# Patient Record
Sex: Male | Born: 1995 | Race: White | Hispanic: No | Marital: Single | Smoking: Current every day smoker
Health system: Southern US, Community
[De-identification: ages and names within clinical notes are randomized; demographics above are authoritative.]

## PROBLEM LIST (undated history)

## (undated) DIAGNOSIS — J45909 Unspecified asthma, uncomplicated: Secondary | ICD-10-CM

## (undated) HISTORY — DX: Unspecified asthma, uncomplicated: J45.909

## (undated) HISTORY — PX: BACK SURGERY: SHX140

---
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
## 2017-01-18 NOTE — Progress Notes (Signed)
[facility]      Assessment and Plan:  21 year old male with persistent cough, recurrent episodes of bronchitis, consistent with the diagnosis of asthma.  Severe persistent asthma, with exacerbation, and acute bronchitis, GERD symptoms, chronic rhinitis with persistent coughing. --Discussed continued use of his current medications.  Given that he has  finally turned the corner with his asthma control, I want to keep him on his current dose of Symbicort for the time being.  If at the next visit he continues to do well we can cut back on his Symbicort to 1 puff once or twice daily. -We will also prescribe a rescue inhaler to be used as needed. -Will refer to allergist for allergy testing, given peripheral eosinophilia seen on CBC  Meds ordered this encounter  Medications  . albuterol (PROVENTIL HFA;VENTOLIN HFA) 108 (90 Base) MCG/ACT inhaler    Sig: Inhale 2 puffs every 6 (six) hours as needed into the lungs.    Dispense:  1 Inhaler    Refill:  3     Return in about 3 months (around 04/22/2017).  Date: 01/18/2017  MRN# [id number] Arnette SchaumannHunter Mayers 07/02/1995   Arnette SchaumannHunter Blaisdell is a 21 y.o. old male seen in consultation for chief complaint of:    Chief Complaint  Patient presents with  . Asthma    Pt here for f/u up and reports cough come and goes. He did have bloodwork and cxr.  . Shortness of Breath    while running.    HPI:  Patient was seen recently with recurrent episodes of bronchitis thought to be consistent with a diagnosis of asthma.  He was started on Symbicort, prednisone taper, omeprazole for symptomatic GERD, Allegra for allergies, and Flonase for allergic rhinitis.  He had smoked up until about 4 months ago. Lab work showed significant peripheral eosinophilia consistent with asthma.  He has been using symbicort 2 puffs twice daily. He is taking prilosec, flonase and allegra once daily.  He feels that symptoms are well controlled and he has  occasional cough.  He is doing much better than he was before.  He is hungry much of the time however he has gained approximately 20 pounds.  **Images personally reviewed, chest x-ray 12/23/16; normal lungs. **CBC 12/23/16, absolute eosinophils equals 600   PMHX:   No past medical history on file. Surgical Hx:  No past surgical history on file. Family Hx:  Family History  Problem Relation Age of Onset  . Pneumonia Paternal Grandmother   . Asthma Paternal Grandmother    Social Hx:   Social History   Tobacco Use  . Smoking status: Former Games developermoker  . Smokeless tobacco: Never Used  Substance Use Topics  . Alcohol use: No  . Drug use: No   Medication:    Current Outpatient Medications:  .  albuterol (PROVENTIL HFA;VENTOLIN HFA) 108 (90 Base) MCG/ACT inhaler, Inhale 2 puffs into the lungs every 6 (six) hours as needed., Disp: , Rfl:  .  budesonide-formoterol (SYMBICORT) 160-4.5 MCG/ACT inhaler, Inhale 2 puffs into the lungs 2 (two) times daily. Rinse mouth after each use, Disp: 1 Inhaler, Rfl: 12 .  predniSONE (STERAPRED UNI-PAK 21 TAB) 10 MG (21) TBPK tablet, Take as directed for 6 days, Disp: 21 tablet, Rfl: 0   Allergies:  Patient has no known allergies.  Review of Systems: Gen:  Denies  fever, sweats, chills HEENT: Denies blurred vision, double vision. bleeds, sore throat Cvc:  No dizziness, chest pain. Resp:   Denies cough  or sputum production, shortness of breath Gi: Denies swallowing difficulty, stomach pain. Gu:  Denies bladder incontinence, burning urine Ext:   No Joint pain, stiffness. Skin: No skin rash,  hives  Endoc:  No polyuria, polydipsia. Psych: No depression, insomnia. Other:  All other systems were reviewed with the patient and were negative other that what is mentioned in the HPI.   Physical Examination:   VS: BP 126/80 (BP Location: Left Arm, Cuff Size: Large)   Pulse 79   Resp 16   Ht 6\' 3"  (1.905 m)   Wt 221 lb (100.2 kg)   SpO2 97%   BMI 27.62  kg/m   General Appearance: coughing profusely.  Neuro:without focal findings,  speech normal,  HEENT: PERRLA, EOM intact.   Pulmonary: normal breath sounds, scattered diffuse bilateral wheezing.  CardiovascularNormal S1,S2.  No m/r/g.   Abdomen: Benign, Soft, non-tender. Renal:  No costovertebral tenderness  GU:  No performed at this time. Endoc: No evident thyromegaly, no signs of acromegaly. Skin:   warm, no rashes, no ecchymosis  Extremities: normal, no cyanosis, clubbing.  Other findings:    LABORATORY PANEL:   CBC No results for input(s): WBC, HGB, HCT, PLT in the last 168 hours. ------------------------------------------------------------------------------------------------------------------  Chemistries  No results for input(s): NA, K, CL, CO2, GLUCOSE, BUN, CREATININE, CALCIUM, MG, AST, ALT, ALKPHOS, BILITOT in the last 168 hours.  Invalid input(s): GFRCGP ------------------------------------------------------------------------------------------------------------------  Cardiac Enzymes No results for input(s): TROPONINI in the last 168 hours. ------------------------------------------------------------  RADIOLOGY:  No results found.     Thank  you for the consultation and for allowing [facility] to assist in the care of your patient. Our recommendations are noted above.  Please contact us if we can be of further service.   Wells Guileseep Joshua Soulier, MD.  Board Certified in Internal Medicine, Pulmonary Medicine, Critical Care Medicine, and Sleep Medicine.  [place] [facility] Office Number: [phone]  Santiago Gladavid Kasa, M.D.  Billy Fischeravid Simonds, M.D  01/18/2017

## 2017-01-20 ENCOUNTER — Ambulatory Visit (INDEPENDENT_AMBULATORY_CARE_PROVIDER_SITE_OTHER): Payer: BLUE CROSS/BLUE SHIELD | Admitting: Internal Medicine

## 2017-01-20 ENCOUNTER — Encounter: Payer: Self-pay | Admitting: Internal Medicine

## 2017-01-20 DIAGNOSIS — J4541 Moderate persistent asthma with (acute) exacerbation: Secondary | ICD-10-CM

## 2017-01-20 MED ORDER — ALBUTEROL SULFATE HFA 108 (90 BASE) MCG/ACT IN AERS: 2.0000 | INHALATION_SPRAY | Freq: Four times a day (QID) | RESPIRATORY_TRACT | 3 refills | Status: DC | PRN

## 2017-01-20 MED ORDER — ALBUTEROL SULFATE 108 (90 BASE) MCG/ACT IN AEPB: 2.0000 | INHALATION_SPRAY | RESPIRATORY_TRACT | 5 refills | Status: DC | PRN

## 2017-01-20 NOTE — Addendum Note (Signed)
Addended by: Shane CrutchAMACHANDRAN, Kristel Durkee on: 01/20/2017 05:24 PM   Modules accepted: Orders

## 2017-01-20 NOTE — Patient Instructions (Addendum)
Will refer to allergist for allergy testing.   Will give presciption for a proventil "rescue" inhaler You can use this when your breathing is worse, take 2 puffs as needed as often as every 4 hours.

## 2017-02-05 ENCOUNTER — Other Ambulatory Visit: Payer: Self-pay | Admitting: Internal Medicine

## 2018-03-10 ENCOUNTER — Telehealth: Payer: Self-pay

## 2018-03-10 NOTE — Telephone Encounter (Signed)
LM for patient to call to make apt, received email from Dr. Belia HemanKasa that patient needs to be seen for recurrent pneumonias.

## 2018-03-16 ENCOUNTER — Encounter: Payer: Self-pay | Admitting: Pulmonary Disease

## 2018-03-16 ENCOUNTER — Ambulatory Visit (INDEPENDENT_AMBULATORY_CARE_PROVIDER_SITE_OTHER): Payer: Commercial Managed Care - PPO | Admitting: Pulmonary Disease

## 2018-03-16 VITALS — BP 104/74 | HR 90 | Resp 16 | Ht 75.0 in | Wt 203.0 lb

## 2018-03-16 DIAGNOSIS — J329 Chronic sinusitis, unspecified: Secondary | ICD-10-CM | POA: Diagnosis not present

## 2018-03-16 DIAGNOSIS — R05 Cough: Secondary | ICD-10-CM | POA: Diagnosis not present

## 2018-03-16 DIAGNOSIS — F17200 Nicotine dependence, unspecified, uncomplicated: Secondary | ICD-10-CM | POA: Diagnosis not present

## 2018-03-16 DIAGNOSIS — J4540 Moderate persistent asthma, uncomplicated: Secondary | ICD-10-CM

## 2018-03-16 DIAGNOSIS — R053 Chronic cough: Secondary | ICD-10-CM

## 2018-03-16 MED ORDER — FLUTICASONE PROPIONATE 50 MCG/ACT NA SUSP: 2.0000 | Freq: Every day | NASAL | 10 refills | Status: DC

## 2018-03-16 MED ORDER — BUDESONIDE-FORMOTEROL FUMARATE 160-4.5 MCG/ACT IN AERO: 2.0000 | INHALATION_SPRAY | Freq: Two times a day (BID) | RESPIRATORY_TRACT | 10 refills | Status: DC

## 2018-03-16 MED ORDER — ALBUTEROL SULFATE 108 (90 BASE) MCG/ACT IN AEPB: 2.0000 | INHALATION_SPRAY | RESPIRATORY_TRACT | 10 refills | Status: DC | PRN

## 2018-03-16 NOTE — Progress Notes (Signed)
ACUTE PULMONARY OFFICE VISIT  CHRONIC PULMONARY PROBLEMS: Smoker Chronic cough Severe persistent asthma Chronic rhinitis  ACUTE PROBLEM: "Recurrent pneumonia"   SUBJ: Formerly seen by Dr Nicholos Johns with documented history of severe persistent asthma.  I do not see any PFTs in our records.  He is added on today and attends this encounter with his father with complaint of "recurrent pneumonia".  His father reports that he has had "6-7 pneumonias since 2017".  None of these episodes resulted in hospitalization.  He does not believe that he got chest x-rays for each of these episodes.  He has been variably treated with antibiotics and/or prednisone.  Last episode was a couple of months ago.  He does not recall how he was treated at that time.  He is unable to say whether these medications made a difference.  His chief complaint is chronic cough productive of white to discolored mucus.  This happens on a nearly daily basis.  He denies significant exertional dyspnea.  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  During boot camp training, he did not feel limited by exertional dyspnea.  He has a prior diagnosis of asthma but this was never proven with PFTs.  He was previously prescribed Symbicort inhaler but he does not use this on a regular basis.  He does not believe that it helps significantly when he does use it "as needed".  He has previously been prescribed an albuterol inhaler but he does not have this medication.  He smokes 1/2 pack cigarettes per day.  He has made no concerted effort to quit smoking.  However, he is willing to work on smoking cessation now.  OBJ: Vitals:   03/16/18 0918  BP: 104/74  Pulse: 90  Resp: 16  SpO2: 97%  Weight: 203 lb (92.1 kg)  Height: 6\' 3"  (1.905 m)   Gen: NAD HEENT: NCAT, sclera white, TMs retracted bilaterally, moderate rhinitis Neck: No JVD Lungs: breath sounds full and the course without wheezes or other  adventitious sounds Cardiovascular: RRR, no murmurs Abdomen: Soft, nontender, normal BS Ext: without clubbing, cyanosis, edema Neuro: grossly intact Skin: Limited exam, no lesions noted  No flowsheet data found.  CBC Latest Ref Rng & Units 12/23/2016  WBC 3.8 - 10.6 K/uL 9.7  Hemoglobin 13.0 - 18.0 g/dL 38.4  Hematocrit 66.5 - 52.0 % 49.2  Platelets 150 - 440 K/uL 247   CXR: No recent film available   I have personally reviewed all chest radiographs reported above including CXRs and CT chest unless otherwise indicated  IMPRESSION: Smoker - Plan: Pulmonary Function Test [facility] Only  Chronic cough - Plan: Pulmonary Function Test [facility] Only  Chronic sinusitis, unspecified location  Probable moderate persistent asthma  - Plan: Pulmonary Function Test [facility] Only  During this encounter, we spent most of the time discussing the imperative of smoking cessation.  For now, asthma is a provisional diagnosis until proven with PFTs and/or response to asthma therapy  PLAN:   NO SMOKING!!! Resume Symbicort 160-4.5, 2 actuations twice a day EVERY DAY.  Rinse mouth after use Resume albuterol inhaler, 2 actuations as needed for increased cough, wheezing, chest tightness, shortness of breath New prescription: Flonase, 2 sprays per nostril daily  Follow-up in 6-8 weeks with PFTs (lung function test) prior to that visit   I have reviewed this patient's medical problems, current medications and therapies and prior pulmonary office notes in evaluation and formulation of the above assessment and plan  Arthur Fischer, MD PCCM service Mobile [phone] Pager [phone] 03/16/2018 10:04 AM

## 2018-03-16 NOTE — Patient Instructions (Signed)
NO SMOKING!!! Resume Symbicort 160-4.5, 2 actuations twice a day EVERY DAY.  Rinse mouth after use Resume albuterol inhaler, 2 actuations as needed for increased cough, wheezing, chest tightness, shortness of breath New prescription: Flonase, 2 sprays per nostril daily  Follow-up in 6-8 weeks with PFTs (lung function test) prior to that visit

## 2018-04-11 ENCOUNTER — Ambulatory Visit: Payer: Commercial Managed Care - PPO | Attending: Pulmonary Disease

## 2018-04-11 DIAGNOSIS — R053 Chronic cough: Secondary | ICD-10-CM

## 2018-04-11 DIAGNOSIS — R05 Cough: Secondary | ICD-10-CM | POA: Diagnosis not present

## 2018-04-11 DIAGNOSIS — F17200 Nicotine dependence, unspecified, uncomplicated: Secondary | ICD-10-CM

## 2018-04-11 DIAGNOSIS — J4540 Moderate persistent asthma, uncomplicated: Secondary | ICD-10-CM | POA: Insufficient documentation

## 2018-04-17 ENCOUNTER — Encounter: Payer: Self-pay | Admitting: Pulmonary Disease

## 2018-04-17 ENCOUNTER — Ambulatory Visit (INDEPENDENT_AMBULATORY_CARE_PROVIDER_SITE_OTHER): Payer: Commercial Managed Care - PPO | Admitting: Pulmonary Disease

## 2018-04-17 VITALS — BP 124/80 | HR 102 | Resp 16 | Ht 75.0 in | Wt 195.0 lb

## 2018-04-17 DIAGNOSIS — J4540 Moderate persistent asthma, uncomplicated: Secondary | ICD-10-CM

## 2018-04-17 NOTE — Patient Instructions (Signed)
Continue Symbicort inhaler, 2 actuations twice a day.  Rinse mouth after use  Continue albuterol inhaler as needed for increased shortness of breath, wheezing, chest tightness, cough  Congratulations on smoking cessation. Do not restart!!!  Follow-up in 6 months.  Call sooner if needed

## 2018-04-20 NOTE — Progress Notes (Signed)
PULMONARY OFFICE FOLLOW-UP VISIT  PT PROFILE: 23 y.o. initially evaluated by Dr. Nicholos Johns with history of persistent asthma  DATA: 04/11/18: FVC: 6.27 > 6.50 L (96 > 100 %pred), FEV1: 4.15 > 5.04 L (76 > 92 %pred), FEV1/FVC: 66%, TLC: 7.53 L (95 %pred), DLCO 114 %pred  INTERVAL: Last seen 03/16/2018.  At that time, Symbicort was resumed and it was emphasized that he use it every day, twice a day.  Flonase was prescribed for symptoms of chronic rhinitis.  He was strongly encouraged to quit smoking.    SUBJ: This is a scheduled follow-up.  There have been no major events since last visit.  He has no new complaints.  His breathing and respiratory status are much improved.  He is using Symbicort compliantly.  He rarely requires albuterol.  He has quit smoking.  He denies CP, fever, purulent sputum, hemoptysis, LE edema and calf tenderness.  OBJ: Vitals:   04/17/18 1336 04/17/18 1337  BP:  124/80  Pulse:  (!) 102  Resp: 16   SpO2:  97%  Weight: 195 lb (88.5 kg)   Height: 6\' 3"  (1.905 m)   RA  Gen: NAD HEENT: NCAT, sclera white Neck: No JVD Lungs: breath sounds full, no wheezes or other adventitious sounds Cardiovascular: RRR, no murmurs Abdomen: Soft, nontender, normal BS Ext: without clubbing, cyanosis, edema Neuro: grossly intact Skin: Limited exam, no lesions noted   No flowsheet data found.  CBC Latest Ref Rng & Units 12/23/2016  WBC 3.8 - 10.6 K/uL 9.7  Hemoglobin 13.0 - 18.0 g/dL 79.7  Hematocrit 28.2 - 52.0 % 49.2  Platelets 150 - 440 K/uL 247   CXR: No new film   I have personally reviewed all chest radiographs reported above including CXRs and CT chest unless otherwise indicated  IMPRESSION: Moderate persistent asthma without complication  Asthma is well controlled now that he has quit smoking and is using his ICS/LABA controller inhaler regularly.  PLAN:   Continue Symbicort inhaler, 2 actuations twice a day.  Rinse mouth after use  Continue albuterol  inhaler as needed for increased shortness of breath, wheezing, chest tightness, cough  I congratulated him on on smoking cessation and emphasized the importance of avoiding relapse.  We discussed risk factors for relapse  Follow-up in 6 months.  Call sooner if needed   Billy Fischer, MD PCCM service Mobile [phone] Pager [phone] 04/20/2018 12:02 PM

## 2018-09-11 ENCOUNTER — Other Ambulatory Visit: Payer: Self-pay

## 2018-09-11 ENCOUNTER — Emergency Department
Admission: EM | Admit: 2018-09-11 | Discharge: 2018-09-11 | Disposition: A | Discharge status: Home / Self Care | Payer: Commercial Managed Care - PPO | Attending: Student in an Organized Health Care Education/Training Program | Admitting: Student in an Organized Health Care Education/Training Program

## 2018-09-11 ENCOUNTER — Encounter: Payer: Self-pay | Admitting: Emergency Medicine

## 2018-09-11 ENCOUNTER — Emergency Department: Payer: Commercial Managed Care - PPO

## 2018-09-11 DIAGNOSIS — Y999 Unspecified external cause status: Secondary | ICD-10-CM | POA: Insufficient documentation

## 2018-09-11 DIAGNOSIS — Z79899 Other long term (current) drug therapy: Secondary | ICD-10-CM | POA: Insufficient documentation

## 2018-09-11 DIAGNOSIS — Y9389 Activity, other specified: Secondary | ICD-10-CM | POA: Diagnosis not present

## 2018-09-11 DIAGNOSIS — W312XXA Contact with powered woodworking and forming machines, initial encounter: Secondary | ICD-10-CM | POA: Insufficient documentation

## 2018-09-11 DIAGNOSIS — S61313A Laceration without foreign body of left middle finger with damage to nail, initial encounter: Secondary | ICD-10-CM | POA: Diagnosis not present

## 2018-09-11 DIAGNOSIS — Y929 Unspecified place or not applicable: Secondary | ICD-10-CM | POA: Diagnosis not present

## 2018-09-11 DIAGNOSIS — S6992XA Unspecified injury of left wrist, hand and finger(s), initial encounter: Secondary | ICD-10-CM | POA: Diagnosis present

## 2018-09-11 DIAGNOSIS — Z87891 Personal history of nicotine dependence: Secondary | ICD-10-CM | POA: Diagnosis not present

## 2018-09-11 MED ORDER — OXYCODONE-ACETAMINOPHEN 5-325 MG PO TABS: 1.0000 | ORAL_TABLET | Freq: Four times a day (QID) | ORAL | 0 refills | Status: DC | PRN

## 2018-09-11 MED ORDER — OXYCODONE-ACETAMINOPHEN 5-325 MG PO TABS
1.0000 | ORAL_TABLET | Freq: Once | ORAL | Status: AC
  Administered 2018-09-11: 1 via ORAL
  Filled 2018-09-11: qty 1

## 2018-09-11 MED ORDER — LIDOCAINE HCL (PF) 1 % IJ SOLN
10.0000 mL | Freq: Once | INTRAMUSCULAR | Status: AC
  Administered 2018-09-11: 10 mL
  Filled 2018-09-11: qty 10

## 2018-09-11 MED ORDER — CEPHALEXIN 500 MG PO CAPS: 1000.0000 mg | ORAL_CAPSULE | Freq: Two times a day (BID) | ORAL | 0 refills | Status: DC

## 2018-09-11 NOTE — ED Provider Notes (Signed)
[facility] Emergency Department Provider Note  ____________________________________________  Time seen: Approximately 8:13 PM  I have reviewed the triage vital signs and the nursing notes.   HISTORY  Chief Complaint Finger Injury    HPI Arthur Guerra is a 23 y.o. male who presents the emergency department complaining of an injury to the middle finger of the left hand.  Patient was using a table saw when his finger accidentally made contact of the blade.  Patient reports injury to the dorsal aspect of the nail.  Bleeding is controlled with direct pressure and bandaging, immediately returns with opening the the bandage.  Full range of motion to the digit.  No other injury or complaint.  Up-to-date on tetanus immunization.         History reviewed. No pertinent past medical history.  There are no active problems to display for this patient.   History reviewed. No pertinent surgical history.  Prior to Admission medications   Medication Sig Start Date End Date Taking? Authorizing Provider  Albuterol Sulfate (PROAIR RESPICLICK) 536 (90 Base) MCG/ACT AEPB Inhale 2 puffs into the lungs every 4 (four) hours as needed. 03/16/18   Wilhelmina Mcardle, MD  budesonide-formoterol (SYMBICORT) 160-4.5 MCG/ACT inhaler Inhale 2 puffs into the lungs 2 (two) times daily. 03/16/18   Wilhelmina Mcardle, MD  cephALEXin (KEFLEX) 500 MG capsule Take 2 capsules (1,000 mg total) by mouth 2 (two) times daily. 09/11/18   [place] Reader, Charline Bills, PA-C  fluticasone (FLONASE) 50 MCG/ACT nasal spray Place 2 sprays into both nostrils daily. 03/16/18 03/16/19  Wilhelmina Mcardle, MD  oxyCODONE-acetaminophen (PERCOCET/ROXICET) 5-325 MG tablet Take 1 tablet by mouth every 6 (six) hours as needed for severe pain. 09/11/18   Flavio Lindroth, Charline Bills, PA-C    Allergies Patient has no known allergies.  Family History  Problem Relation Age of Onset  . Pneumonia Paternal Grandmother   . Asthma Paternal Grandmother      Social History Social History   Tobacco Use  . Smoking status: Former Research scientist (life sciences)  . Smokeless tobacco: Never Used  Substance Use Topics  . Alcohol use: No  . Drug use: No     Review of Systems  Constitutional: No fever/chills Eyes: No visual changes. No discharge ENT: No upper respiratory complaints. Cardiovascular: no chest pain. Respiratory: no cough. No SOB. Gastrointestinal: No abdominal pain.  No nausea, no vomiting.  No diarrhea.  No constipation. Musculoskeletal: Injury to the middle finger of the left hand Skin: Negative for rash, abrasions, lacerations, ecchymosis. Neurological: Negative for headaches, focal weakness or numbness. 10-point ROS otherwise negative.  ____________________________________________   PHYSICAL EXAM:  VITAL SIGNS: ED Triage Vitals  Enc Vitals Group     BP 09/11/18 1817 (!) 137/99     Pulse Rate 09/11/18 1817 91     Resp 09/11/18 1817 16     Temp 09/11/18 1817 98.1 F (36.7 C)     Temp Source 09/11/18 1817 Oral     SpO2 09/11/18 1817 96 %     Weight 09/11/18 1818 216 lb (98 kg)     Height 09/11/18 1818 6\' 3"  (1.905 m)     Head Circumference --      Peak Flow --      Pain Score 09/11/18 1818 8     Pain Loc --      Pain Edu? --      Excl. in [place]? --      Constitutional: Alert and oriented. Well appearing and in no  acute distress. Eyes: Conjunctivae are normal. PERRL. EOMI. Head: Atraumatic. Neck: No stridor.    Cardiovascular: Normal rate, regular rhythm. Normal S1 and S2.  Good peripheral circulation. Respiratory: Normal respiratory effort without tachypnea or retractions. Lungs CTAB. Good air entry to the bases with no decreased or absent breath sounds. Musculoskeletal: Full range of motion to all extremities. No gross deformities appreciated.  Visualization of the third digit left hand reveals deep avulsion type injury involving the nailbed.  Active bleeding.  No visible foreign body.  Full extension and flexion of the digit.   Sensation intact.  Good blood flow identified.  Avulsion type laceration measures approximately 1.5 cm in length.  This involves the lateral aspect of the nailbed. Neurologic:  Normal speech and language. No gross focal neurologic deficits are appreciated.  Skin:  Skin is warm, dry and intact. No rash noted. Psychiatric: Mood and affect are normal. Speech and behavior are normal. Patient exhibits appropriate insight and judgement.   ____________________________________________   LABS (all labs ordered are listed, but only abnormal results are displayed)  Labs Reviewed - No data to display ____________________________________________  EKG   ____________________________________________  RADIOLOGY I personally viewed and evaluated these images as part of my medical decision making, as well as reviewing the written report by the radiologist.  Dg Hand Complete Left  Result Date: 09/11/2018 CLINICAL DATA:  Table saw injury to third digit LEFT hand EXAM: LEFT HAND - COMPLETE 3+ VIEW COMPARISON:  None FINDINGS: Soft tissue swelling and irregularity at distal phalanx middle finger. Osseous mineralization normal. Joint spaces preserved. No acute fracture, dislocation, or bone destruction. Minimal soft tissue gas at distal phalanx middle finger but no radiopaque foreign body seen. IMPRESSION: No acute osseous abnormalities. Electronically Signed   By: Ulyses SouthwardMark  Boles M.D.   On: 09/11/2018 19:37    ____________________________________________    PROCEDURES  Procedure(s) performed:    Procedures    Medications  lidocaine (PF) (XYLOCAINE) 1 % injection 10 mL (has no administration in time range)  oxyCODONE-acetaminophen (PERCOCET/ROXICET) 5-325 MG per tablet 1 tablet (1 tablet Oral Given 09/11/18 1920)     ____________________________________________   INITIAL IMPRESSION / ASSESSMENT AND PLAN / ED COURSE  Pertinent labs & imaging results that were available during my care of the  patient were reviewed by me and considered in my medical decision making (see chart for details).  Review of the [place] CSRS was performed in accordance of the [facility] prior to dispensing any controlled drugs.           Patient's diagnosis is consistent with finger laceration.  Patient presented to the emergency department with a laceration to the third digit of the left hand.  Patient has a deep avulsion laceration.  Ragged edges.  Active bleeding.  Area was thoroughly cleansed.  Finger was anesthetized to perform cleaning and wound management.  There were no edges that were able to be closed.  Area was packed with Surgicel dressing, bandaged appropriately.  Wound care instructions discussed with patient.  Patient be placed on antibiotics prophylactically.  Pain medication for symptom relief.  Follow-up with primary care as needed. Patient is given ED precautions to return to the ED for any worsening or new symptoms.     ____________________________________________  FINAL CLINICAL IMPRESSION(S) / ED DIAGNOSES  Final diagnoses:  Laceration of left middle finger without foreign body with damage to nail, initial encounter      NEW MEDICATIONS STARTED DURING THIS VISIT:  ED Discharge Orders  Ordered    cephALEXin (KEFLEX) 500 MG capsule  2 times daily     09/11/18 2040    oxyCODONE-acetaminophen (PERCOCET/ROXICET) 5-325 MG tablet  Every 6 hours PRN     09/11/18 2040              This chart was dictated using voice recognition software/Dragon. Despite best efforts to proofread, errors can occur which can change the meaning. Any change was purely unintentional.    Racheal PatchesCuthriell, Zeppelin Commisso D, PA-C 09/11/18 2041    Willy Eddyobinson, Patrick, MD 09/11/18 2138

## 2018-09-11 NOTE — ED Triage Notes (Signed)
Pt arrives with injury to left middle finger. Pt cut it on table saw. Laceration to middle finger only and is covered with gauze.

## 2018-09-11 NOTE — ED Notes (Signed)
See triage note  Presents with laceration to left middle finger  States he was using a table saw slipped  Laceration to middle finger

## 2018-09-22 ENCOUNTER — Emergency Department: Payer: Commercial Managed Care - PPO

## 2018-09-22 ENCOUNTER — Emergency Department
Admission: EM | Admit: 2018-09-22 | Discharge: 2018-09-22 | Disposition: A | Discharge status: Home / Self Care | Payer: Commercial Managed Care - PPO | Attending: Emergency Medicine | Admitting: Emergency Medicine

## 2018-09-22 ENCOUNTER — Other Ambulatory Visit: Payer: Self-pay

## 2018-09-22 ENCOUNTER — Encounter: Payer: Self-pay | Admitting: Emergency Medicine

## 2018-09-22 DIAGNOSIS — J9811 Atelectasis: Secondary | ICD-10-CM | POA: Diagnosis not present

## 2018-09-22 DIAGNOSIS — M542 Cervicalgia: Secondary | ICD-10-CM | POA: Diagnosis not present

## 2018-09-22 DIAGNOSIS — Z03818 Encounter for observation for suspected exposure to other biological agents ruled out: Secondary | ICD-10-CM | POA: Insufficient documentation

## 2018-09-22 DIAGNOSIS — S32019A Unspecified fracture of first lumbar vertebra, initial encounter for closed fracture: Secondary | ICD-10-CM | POA: Insufficient documentation

## 2018-09-22 DIAGNOSIS — Y9389 Activity, other specified: Secondary | ICD-10-CM | POA: Diagnosis not present

## 2018-09-22 DIAGNOSIS — W132XXA Fall from, out of or through roof, initial encounter: Secondary | ICD-10-CM | POA: Diagnosis not present

## 2018-09-22 DIAGNOSIS — Z87891 Personal history of nicotine dependence: Secondary | ICD-10-CM | POA: Insufficient documentation

## 2018-09-22 DIAGNOSIS — T1490XA Injury, unspecified, initial encounter: Secondary | ICD-10-CM

## 2018-09-22 DIAGNOSIS — Y990 Civilian activity done for income or pay: Secondary | ICD-10-CM | POA: Diagnosis not present

## 2018-09-22 DIAGNOSIS — F40240 Claustrophobia: Secondary | ICD-10-CM | POA: Insufficient documentation

## 2018-09-22 DIAGNOSIS — S3992XA Unspecified injury of lower back, initial encounter: Secondary | ICD-10-CM | POA: Diagnosis present

## 2018-09-22 DIAGNOSIS — Y929 Unspecified place or not applicable: Secondary | ICD-10-CM | POA: Insufficient documentation

## 2018-09-22 DIAGNOSIS — R1010 Upper abdominal pain, unspecified: Secondary | ICD-10-CM | POA: Insufficient documentation

## 2018-09-22 LAB — APTT: aPTT: 25 seconds (ref 24–36)

## 2018-09-22 LAB — COMPREHENSIVE METABOLIC PANEL
ALT: 49 U/L — ABNORMAL HIGH (ref 0–44)
AST: 44 U/L — ABNORMAL HIGH (ref 15–41)
Albumin: 4.8 g/dL (ref 3.5–5.0)
Alkaline Phosphatase: 67 U/L (ref 38–126)
Anion gap: 10 (ref 5–15)
BUN: 19 mg/dL (ref 6–20)
CO2: 26 mmol/L (ref 22–32)
Calcium: 10.2 mg/dL (ref 8.9–10.3)
Chloride: 107 mmol/L (ref 98–111)
Creatinine, Ser: 1.29 mg/dL — ABNORMAL HIGH (ref 0.61–1.24)
GFR calc Af Amer: >60 mL/min (ref >60)
GFR calc non Af Amer: >60 mL/min (ref >60)
Glucose, Bld: 119 mg/dL — ABNORMAL HIGH (ref 70–99)
Potassium: 3.7 mmol/L (ref 3.5–5.1)
Sodium: 143 mmol/L (ref 135–145)
Total Bilirubin: 0.5 mg/dL (ref 0.3–1.2)
Total Protein: 7.5 g/dL (ref 6.5–8.1)

## 2018-09-22 LAB — CBC WITH DIFFERENTIAL/PLATELET
Abs Immature Granulocytes: 0.13 10*3/uL — ABNORMAL HIGH (ref 0.00–0.07)
Basophils Absolute: 0 10*3/uL (ref 0.0–0.1)
Basophils Relative: 0 %
Eosinophils Absolute: 0.3 10*3/uL (ref 0.0–0.5)
Eosinophils Relative: 2 %
HCT: 47.9 % (ref 39.0–52.0)
Hemoglobin: 16.6 g/dL (ref 13.0–17.0)
Immature Granulocytes: 1 %
Lymphocytes Relative: 36 %
Lymphs Abs: 4.2 10*3/uL — ABNORMAL HIGH (ref 0.7–4.0)
MCH: 31.1 pg (ref 26.0–34.0)
MCHC: 34.7 g/dL (ref 30.0–36.0)
MCV: 89.7 fL (ref 80.0–100.0)
Monocytes Absolute: 0.7 10*3/uL (ref 0.1–1.0)
Monocytes Relative: 6 %
Neutro Abs: 6.2 10*3/uL (ref 1.7–7.7)
Neutrophils Relative %: 55 %
Platelets: 270 10*3/uL (ref 150–400)
RBC: 5.34 MIL/uL (ref 4.22–5.81)
RDW: 12.2 % (ref 11.5–15.5)
WBC: 11.6 10*3/uL — ABNORMAL HIGH (ref 4.0–10.5)
nRBC: 0 % (ref 0.0–0.2)

## 2018-09-22 LAB — SARS CORONAVIRUS 2 BY RT PCR (HOSPITAL ORDER, PERFORMED IN ~~LOC~~ HOSPITAL LAB): SARS Coronavirus 2: NEGATIVE

## 2018-09-22 LAB — SAMPLE TO BLOOD BANK

## 2018-09-22 LAB — URINE DRUG SCREEN, QUALITATIVE (ARMC ONLY)
Amphetamines, Ur Screen: NOT DETECTED
Barbiturates, Ur Screen: NOT DETECTED
Benzodiazepine, Ur Scrn: NOT DETECTED
Cannabinoid 50 Ng, Ur ~~LOC~~: POSITIVE — AB
Cocaine Metabolite,Ur ~~LOC~~: POSITIVE — AB
MDMA (Ecstasy)Ur Screen: NOT DETECTED
Methadone Scn, Ur: NOT DETECTED
Opiate, Ur Screen: POSITIVE — AB
Phencyclidine (PCP) Ur S: NOT DETECTED
Tricyclic, Ur Screen: NOT DETECTED

## 2018-09-22 LAB — PROTIME-INR
INR: 1 (ref 0.8–1.2)
Prothrombin Time: 13.3 seconds (ref 11.4–15.2)

## 2018-09-22 MED ORDER — IOHEXOL 300 MG/ML  SOLN
100.0000 mL | Freq: Once | INTRAMUSCULAR | Status: AC | PRN
  Administered 2018-09-22: 10:00:00 100 mL via INTRAVENOUS

## 2018-09-22 MED ORDER — ONDANSETRON HCL 4 MG/2ML IJ SOLN
4.0000 mg | Freq: Once | INTRAMUSCULAR | Status: AC
  Administered 2018-09-22: 10:00:00 4 mg via INTRAVENOUS

## 2018-09-22 MED ORDER — MORPHINE SULFATE (PF) 4 MG/ML IV SOLN
4.0000 mg | Freq: Once | INTRAVENOUS | Status: DC
  Filled 2018-09-22: qty 1

## 2018-09-22 MED ORDER — MORPHINE SULFATE (PF) 4 MG/ML IV SOLN
4.0000 mg | Freq: Once | INTRAVENOUS | Status: AC
  Administered 2018-09-22: 13:00:00 4 mg via INTRAVENOUS
  Filled 2018-09-22: qty 1

## 2018-09-22 MED ORDER — ONDANSETRON HCL 4 MG/2ML IJ SOLN
INTRAMUSCULAR | Status: AC
  Administered 2018-09-22: 4 mg via INTRAVENOUS
  Filled 2018-09-22: qty 2

## 2018-09-22 MED ORDER — MORPHINE SULFATE (PF) 2 MG/ML IV SOLN
INTRAVENOUS | Status: AC
  Administered 2018-09-22: 10:00:00 4 mg via INTRAVENOUS
  Filled 2018-09-22: qty 2

## 2018-09-22 MED ORDER — MORPHINE SULFATE (PF) 4 MG/ML IV SOLN
4.0000 mg | Freq: Once | INTRAVENOUS | Status: AC
  Administered 2018-09-22: 16:00:00 4 mg via INTRAVENOUS

## 2018-09-22 NOTE — ED Triage Notes (Signed)
Here for 15 fall per pts boss.  Pt fell off roof while working. Laceration to left fingers.  Severe lower back pain shooting up back per pt.  philly collar applied at check in desk.  Pt mildly pale when taken out of car.   Pt was ambulatory initially. No LOC.   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  Per [employer] under his name, not a company name.  [phone].  Urine drug screen requested by Rogue Bussing, owner.

## 2018-09-22 NOTE — ED Notes (Signed)
Lacerations to pt's right and left hand were cleansed and wrapped with a dry dressing.

## 2018-09-22 NOTE — ED Notes (Signed)
Pt is AOx4, vss, he continues to c/o back pain at 10/10, he is on the monitor and in bed with rails upx2. He also c/o feeling "claustaphobic" with the c collar in place. The pt was educated on the importance of the C colllar remaining on and in place until all scan come back and are negative for fractures. He verbalized understanding of the education.

## 2018-09-22 NOTE — ED Notes (Signed)
Pt taken to CT.

## 2018-09-22 NOTE — ED Notes (Signed)
Pt states that he does not want to file this injury as worker's compensation.

## 2018-09-22 NOTE — ED Provider Notes (Addendum)
[facility] Emergency Department Provider Note   ____________________________________________    I have reviewed the triage vital signs and the nursing notes.   HISTORY  Chief Complaint Trauma     HPI Arthur Guerra is a 23 y.o. male who presents after a fall from roof.  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  He complains of severe low back pain.  Denies head injury or LOC.  Mild soreness to his neck.  No chest pain or extremity injury besides some abrasions.  Does describe some abdominal discomfort.  No leg injuries reported.  History reviewed. No pertinent past medical history.  There are no active problems to display for this patient.   History reviewed. No pertinent surgical history.  Prior to Admission medications   Medication Sig Start Date End Date Taking? Authorizing Provider  Albuterol Sulfate (PROAIR RESPICLICK) 108 (90 Base) MCG/ACT AEPB Inhale 2 puffs into the lungs every 4 (four) hours as needed. 03/16/18  Yes Merwyn KatosSimonds, David B, MD     Allergies Patient has no known allergies.  Family History  Problem Relation Age of Onset  . Pneumonia Paternal Grandmother   . Asthma Paternal Grandmother     Social History Social History   Tobacco Use  . Smoking status: Former Games developermoker  . Smokeless tobacco: Never Used  Substance Use Topics  . Alcohol use: No  . Drug use: No    Review of Systems  Constitutional: No dizziness Eyes: No visual changes.  ENT: Neck discomfort as above Cardiovascular: Denies chest pain. Respiratory: Denies shortness of breath. Gastrointestinal: As above, no vomiting Genitourinary: No groin injury Musculoskeletal: Negative for back pain. Skin: Negative for rash. Neurological: Negative for headaches    ____________________________________________   PHYSICAL EXAM:  VITAL SIGNS: ED Triage Vitals  Enc Vitals Group     BP 09/22/18 1000  140/89     Pulse Rate 09/22/18 1000 88     Resp 09/22/18 1000 (!) 25     Temp --      Temp src --      SpO2 09/22/18 1000 97 %     Weight 09/22/18 0921 98 kg (216 lb)     Height 09/22/18 0921 1.905 m (6\' 3" )     Head Circumference --      Peak Flow --      Pain Score 09/22/18 0921 10     Pain Loc --      Pain Edu? --      Excl. in [place]? --     Constitutional: Alert and oriented.  Eyes: Conjunctivae are normal.  Head: No hematoma.  No facial trauma Nose: No congestion/rhinnorhea. Mouth/Throat: Mucous membranes are moist.   Neck: C-collar in place Cardiovascular: Normal rate, regular rhythm. Grossly normal heart sounds.  Good peripheral circulation.  No chest wall tenderness palpation, no clavicle pain, no tenderness to the shoulders Respiratory: Normal respiratory effort.  No retractions. Lungs CTAB. Gastrointestinal: Mild tenderness in the upper abdomen, no distention. Genitourinary: No groin injury, normal exam Musculoskeletal: Moves all extremities well and without pain.  Warm and well perfused.  Several deeper abrasions to the hands bilaterally.  Patient complains of pain to his lumbar spine area, normal strength in the lower extremities no incontinence Neurologic:  Normal speech and language. No gross focal neurologic deficits are appreciated.  Skin:  Skin is warm, dry, abrasions as above Psychiatric: Mood and  affect are normal. Speech and behavior are normal.  ____________________________________________   LABS (all labs ordered are listed, but only abnormal results are displayed)  Labs Reviewed  CBC WITH DIFFERENTIAL/PLATELET - Abnormal; Notable for the following components:      Result Value   WBC 11.6 (*)    Lymphs Abs 4.2 (*)    Abs Immature Granulocytes 0.13 (*)    All other components within normal limits  COMPREHENSIVE METABOLIC PANEL - Abnormal; Notable for the following components:   Glucose, Bld 119 (*)    Creatinine, Ser 1.29 (*)    AST 44 (*)    ALT 49  (*)    All other components within normal limits  SARS CORONAVIRUS 2 (HOSPITAL ORDER, [place] [facility])  APTT  PROTIME-INR  URINE DRUG SCREEN, QUALITATIVE ([facility] ONLY)  SAMPLE TO BLOOD BANK   ____________________________________________  EKG  None ____________________________________________  RADIOLOGY  CT head, cervical spine, chest abdomen pelvis, TL spine MR lumbar spine ____________________________________________   PROCEDURES  Procedure(s) performed: No  Procedures   Critical Care performed: yes  CRITICAL CARE Performed by: Lavonia Drafts   Total critical care time: 45 minutes  Critical care time was exclusive of separately billable procedures and treating other patients.  Critical care was necessary to treat or prevent imminent or life-threatening deterioration.  Critical care was time spent personally by me on the following activities: development of treatment plan with patient and/or surrogate as well as nursing, discussions with consultants, evaluation of patient's response to treatment, examination of patient, obtaining history from patient or surrogate, ordering and performing treatments and interventions, ordering and review of laboratory studies, ordering and review of radiographic studies, pulse oximetry and re-evaluation of patient's condition.  ____________________________________________   INITIAL IMPRESSION / ASSESSMENT AND PLAN / ED COURSE  Pertinent labs & imaging results that were available during my care of the patient were reviewed by me and considered in my medical decision making (see chart for details).  Patient presents after significant fall from at least 15 feet onto his back.  Does have some abdominal tenderness and complains of significant back pain.  Will treat with IV morphine, IV Zofran, sent for CT head cervical spine chest abdomen pelvis with T and L-spine reconstructions.  Vitals are stable    ----------------------------------------- 11:41 AM on 09/22/2018 -----------------------------------------  Contacted by radiology and notified of T12 superior endplate fracture, mild bony retropulsion, luckily only minimal canal narrowing.  Otherwise CT scans reassuring.  Normal neuro exam.  Discussed with Dr. Cari Caraway of neurosurgery who recommends MRI   ----------------------------------------- 3:14 PM on 09/22/2018 -----------------------------------------  Contacted by radiology regarding epidural hemorrhage with mass-effect on the thecal sac, discussed these findings with Dr. Cari Caraway of neurosurgery who recommends transfer to [facility] for further evaluation and treatment.  Discussed this with the patient and he agrees with the plan   Accepted as trauma transfer by Dr. Justice Rocher at [facility]    ____________________________________________   FINAL CLINICAL IMPRESSION(S) / ED DIAGNOSES  Final diagnoses:  Trauma  Fall from roof as cause of accidental injury  Closed fracture of first lumbar vertebra, unspecified fracture morphology, initial encounter [facility])        Note:  This document was prepared using Dragon voice recognition software and may include unintentional dictation errors.   Lavonia Drafts, MD 09/22/18 1516    Lavonia Drafts, MD 09/22/18 [phone]

## 2018-09-22 NOTE — ED Notes (Signed)
This RN spoke with pt and asked him if he wanted to file claim under worker comp insurance. Pt declines at this time stating that he does not want it filed under workers comp.

## 2018-09-22 NOTE — ED Notes (Signed)
Pt refuses to keep c collar on despite thorough education provided by this RN.

## 2018-11-10 ENCOUNTER — Emergency Department: Payer: Commercial Managed Care - PPO

## 2018-11-10 ENCOUNTER — Emergency Department
Admission: EM | Admit: 2018-11-10 | Discharge: 2018-11-10 | Disposition: A | Discharge status: Home / Self Care | Payer: Commercial Managed Care - PPO | Attending: Emergency Medicine | Admitting: Emergency Medicine

## 2018-11-10 ENCOUNTER — Other Ambulatory Visit: Payer: Self-pay

## 2018-11-10 DIAGNOSIS — Y999 Unspecified external cause status: Secondary | ICD-10-CM | POA: Diagnosis not present

## 2018-11-10 DIAGNOSIS — M7918 Myalgia, other site: Secondary | ICD-10-CM | POA: Diagnosis not present

## 2018-11-10 DIAGNOSIS — G501 Atypical facial pain: Secondary | ICD-10-CM | POA: Diagnosis present

## 2018-11-10 DIAGNOSIS — Y92410 Unspecified street and highway as the place of occurrence of the external cause: Secondary | ICD-10-CM | POA: Insufficient documentation

## 2018-11-10 DIAGNOSIS — M549 Dorsalgia, unspecified: Secondary | ICD-10-CM | POA: Insufficient documentation

## 2018-11-10 DIAGNOSIS — Y939 Activity, unspecified: Secondary | ICD-10-CM | POA: Insufficient documentation

## 2018-11-10 DIAGNOSIS — Z87891 Personal history of nicotine dependence: Secondary | ICD-10-CM | POA: Diagnosis not present

## 2018-11-10 DIAGNOSIS — S70311A Abrasion, right thigh, initial encounter: Secondary | ICD-10-CM | POA: Insufficient documentation
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
## 2018-11-10 NOTE — ED Notes (Signed)
Pt verbalized understanding of discharge instructions. NAD at this time. 
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
## 2019-08-06 ENCOUNTER — Other Ambulatory Visit: Payer: Self-pay | Admitting: Neurological Surgery

## 2019-08-06 DIAGNOSIS — M545 Low back pain, unspecified: Secondary | ICD-10-CM

## 2019-08-06 DIAGNOSIS — G8929 Other chronic pain: Secondary | ICD-10-CM

## 2019-08-06 DIAGNOSIS — Z981 Arthrodesis status: Secondary | ICD-10-CM

## 2019-08-23 ENCOUNTER — Ambulatory Visit: Payer: 59

## 2019-08-23 ENCOUNTER — Ambulatory Visit
Admission: RE | Admit: 2019-08-23 | Discharge: 2019-08-23 | Disposition: A | Discharge status: Home / Self Care | Payer: 59 | Source: Ambulatory Visit | Attending: Neurological Surgery | Admitting: Neurological Surgery

## 2019-08-23 ENCOUNTER — Other Ambulatory Visit: Payer: Self-pay

## 2019-08-23 DIAGNOSIS — Z981 Arthrodesis status: Secondary | ICD-10-CM | POA: Insufficient documentation

## 2019-08-26 ENCOUNTER — Ambulatory Visit: Admission: RE | Admit: 2019-08-26 | Payer: 59 | Source: Ambulatory Visit

## 2019-09-09 ENCOUNTER — Ambulatory Visit: Payer: 59

## 2019-09-09 ENCOUNTER — Ambulatory Visit: Admission: RE | Admit: 2019-09-09 | Payer: 59 | Source: Ambulatory Visit

## 2019-11-08 ENCOUNTER — Ambulatory Visit
Admission: RE | Admit: 2019-11-08 | Discharge: 2019-11-08 | Disposition: A | Discharge status: Home / Self Care | Payer: 59 | Source: Ambulatory Visit | Attending: Neurological Surgery | Admitting: Neurological Surgery

## 2019-11-08 ENCOUNTER — Other Ambulatory Visit: Payer: Self-pay

## 2019-11-08 ENCOUNTER — Ambulatory Visit: Payer: 59

## 2019-11-08 DIAGNOSIS — M545 Low back pain, unspecified: Secondary | ICD-10-CM

## 2019-11-08 DIAGNOSIS — G8929 Other chronic pain: Secondary | ICD-10-CM | POA: Insufficient documentation

## 2019-11-08 DIAGNOSIS — Z981 Arthrodesis status: Secondary | ICD-10-CM | POA: Insufficient documentation

## 2020-01-29 IMAGING — CT CT LUMBAR SPINE WITHOUT CONTRAST
3 series · 15 of 33 positions shown, 18 images · non-contrast
Comparison: Chest, abdomen and pelvis CT obtained at the same time.
COMPARISON: Chest, abdomen and pelvis CT obtained at the same time.

Addendum:
CLINICAL DATA: Severe back pain after falling 15 ft off a roof.

EXAM:
CT THORACIC AND LUMBAR SPINE WITHOUT CONTRAST
TECHNIQUE: Multidetector CT imaging of the thoracic and lumbar spine was
performed without contrast. Multiplanar CT image reconstructions
were also generated.

[Series 505: coronal · coronal · 0.64mm/px · 3 of 66 slices shown]
[im 14/66  bone]
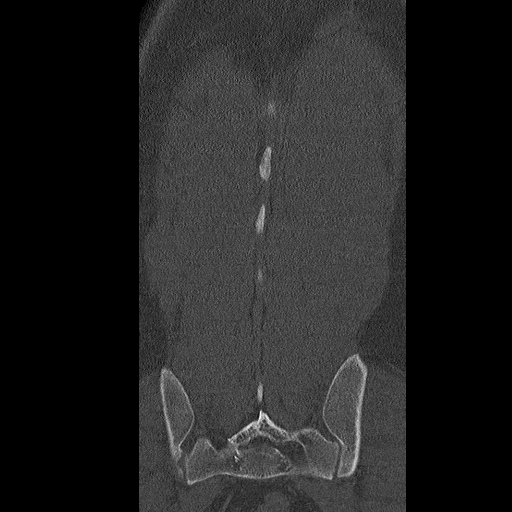
[im 27/66  bone]
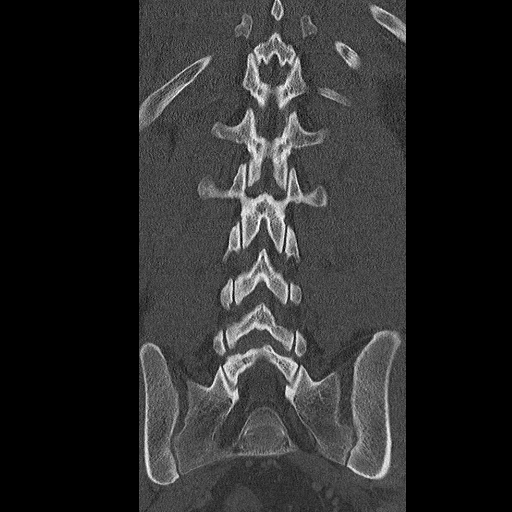
[im 40/66  bone]
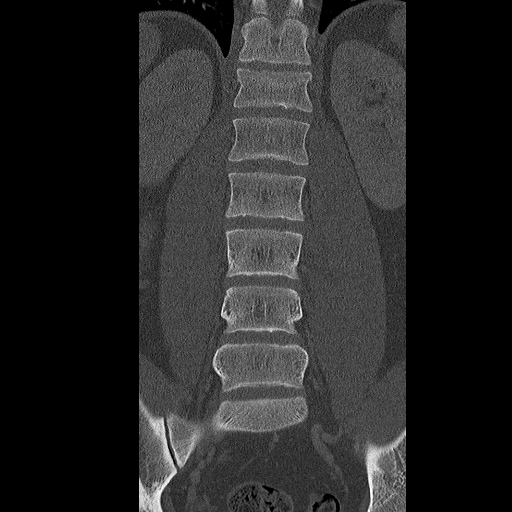

[Series 506: sagittal · sagittal · 0.64mm/px · 5 of 83 slices shown, 6 images]
[im 28/83  bone]
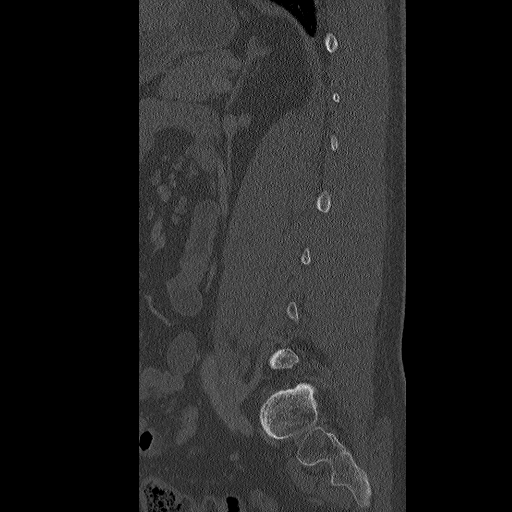
[im 35/83  bone]
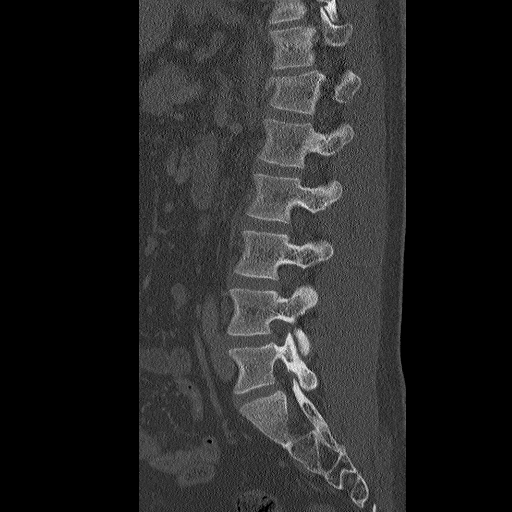
[im 42/83  soft-tissue]
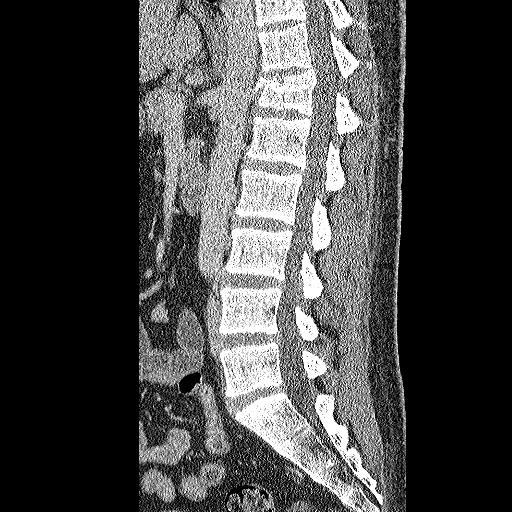
[im 42/83  bone]
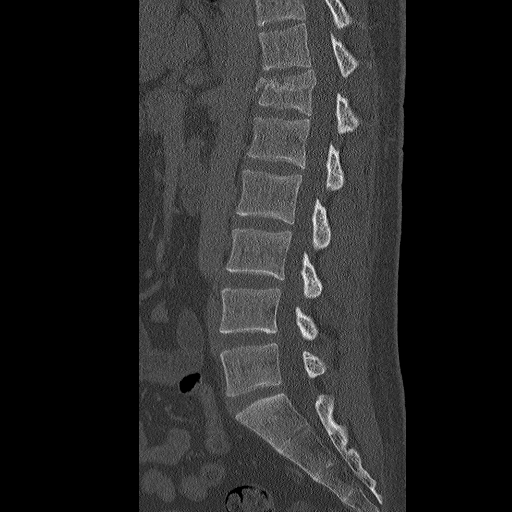
[im 48/83  bone]
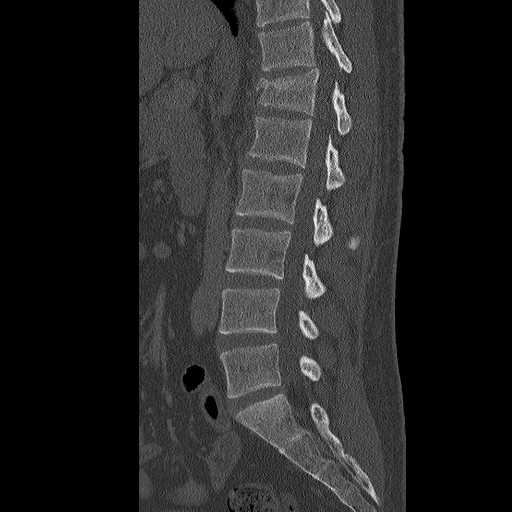
[im 55/83  bone]
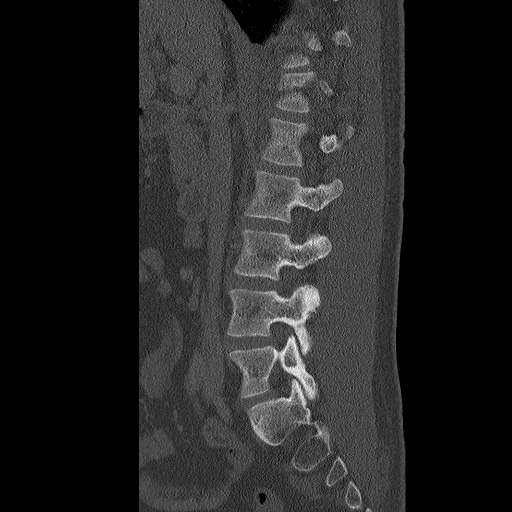

[Series 508: axial st · axial · 0.34mm/px · z∈[-593,-355]mm · 7 of 141 slices shown, 9 images]
[im 11/141  soft-tissue]
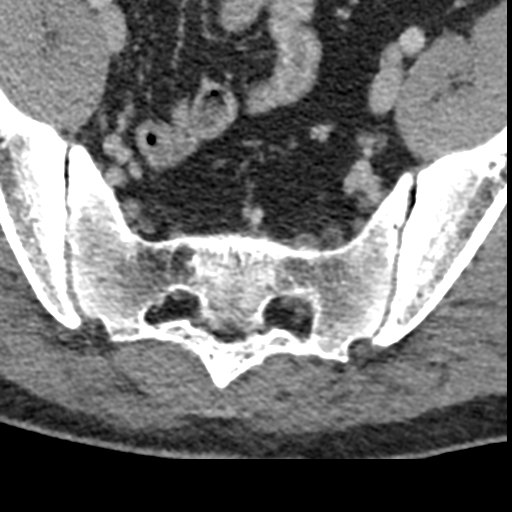
[im 11/141  bone]
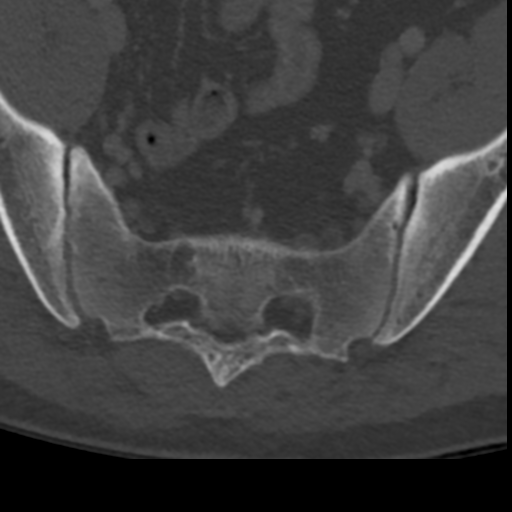
[im 33/141  bone]
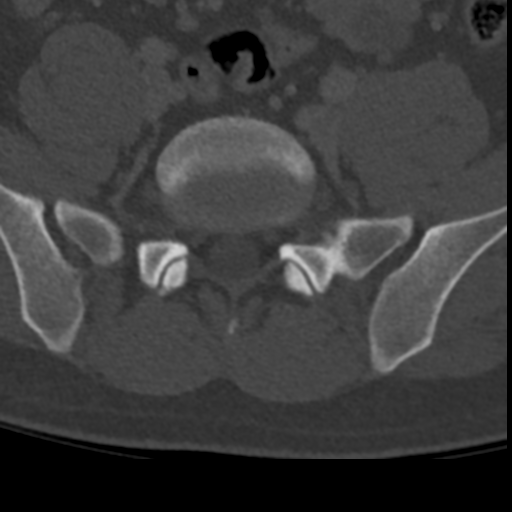
[im 54/141  bone]
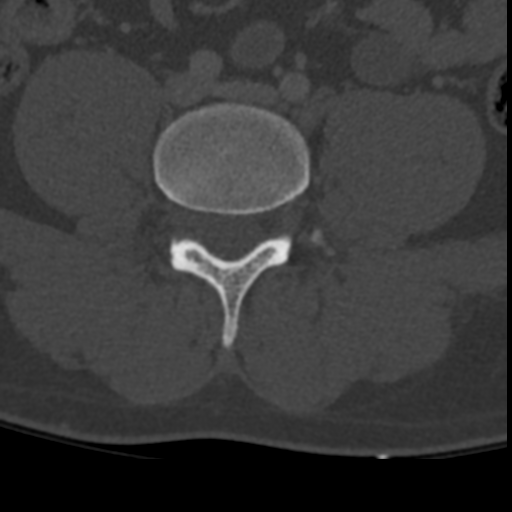
[im 76/141  bone]
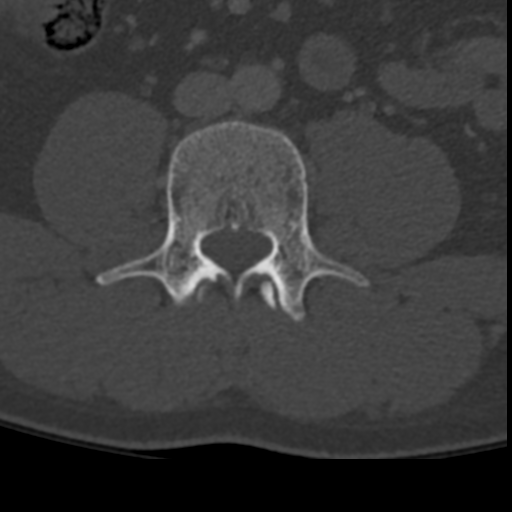
[im 87/141  soft-tissue]
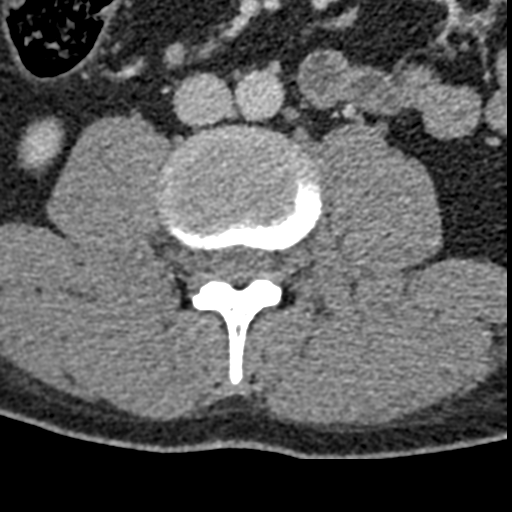
[im 87/141  bone]
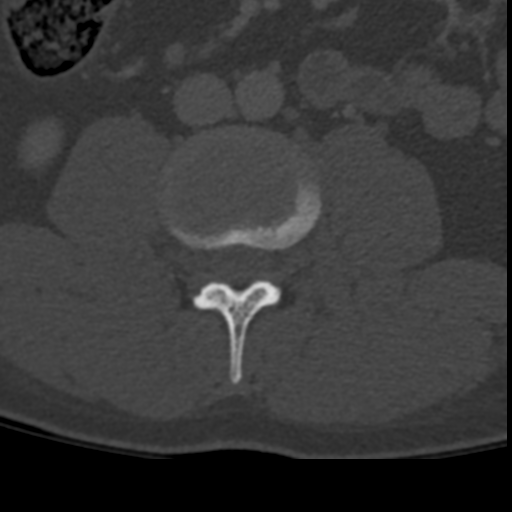
[im 108/141  bone]
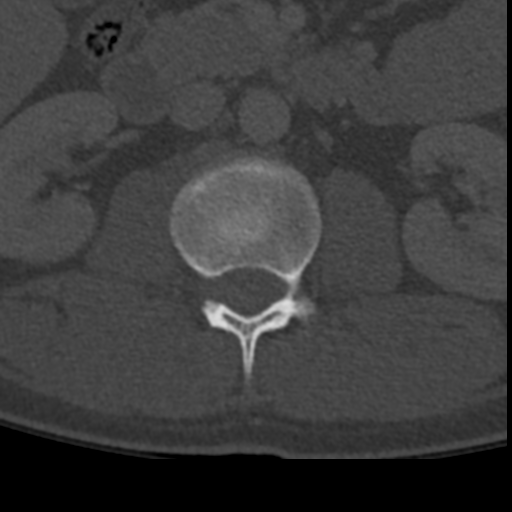
[im 130/141  bone]
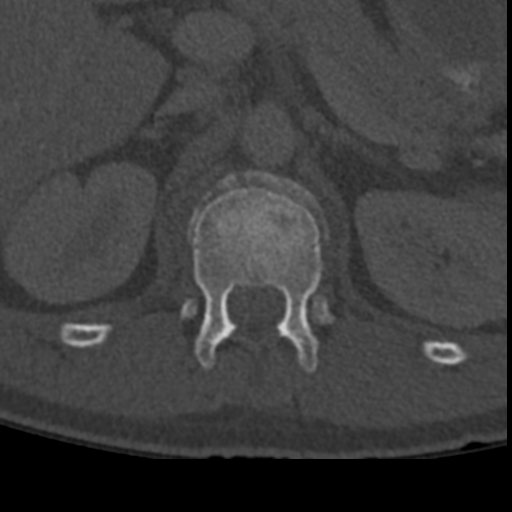

[15 of 33 positions shown; findings below may reference images not displayed]

FINDINGS: CT THORACIC SPINE FINDINGS

Alignment: Mild acute kyphosis at the T11-12 level. No subluxations.

Vertebrae: Approximately 30% acute T12 superior endplate compression
fracture with mild bony retropulsion. Minimal associated canal
narrowing. The posterior elements are intact.

Paraspinal and other soft tissues: Minimal anterior and bilateral
paraspinal hemorrhage at the level of the T12 fracture.

Disc levels: Unremarkable.

CT LUMBAR SPINE FINDINGS

Segmentation: 5 non-rib-bearing lumbar vertebrae.

Alignment: Normal.

Vertebrae: No acute fracture or focal pathologic process.

Paraspinal and other soft tissues: Unremarkable.

Disc levels: Unremarkable.
IMPRESSION: 1. Approximately 30% acute T12 superior endplate compression
fracture with mild bony retropulsion. Minimal associated canal
narrowing. There is associated mild acute kyphosis at the T11-12
level
2. No evidence of additional fractures or malalignment of the
thoracic or lumbar spine.

ADDENDUM:
These results were called by telephone at the time of interpretation
on 09/22/2018 at [DATE] to Dr. ULI SIA , who verbally
acknowledged these results.

*** End of Addendum ***
FINDINGS: CT THORACIC SPINE FINDINGS

Alignment: Mild acute kyphosis at the T11-12 level. No subluxations.

Vertebrae: Approximately 30% acute T12 superior endplate compression
fracture with mild bony retropulsion. Minimal associated canal
narrowing. The posterior elements are intact.

Paraspinal and other soft tissues: Minimal anterior and bilateral
paraspinal hemorrhage at the level of the T12 fracture.

Disc levels: Unremarkable.

CT LUMBAR SPINE FINDINGS

Segmentation: 5 non-rib-bearing lumbar vertebrae.

Alignment: Normal.

Vertebrae: No acute fracture or focal pathologic process.

Paraspinal and other soft tissues: Unremarkable.

Disc levels: Unremarkable.
IMPRESSION: 1. Approximately 30% acute T12 superior endplate compression
fracture with mild bony retropulsion. Minimal associated canal
narrowing. There is associated mild acute kyphosis at the T11-12
level
2. No evidence of additional fractures or malalignment of the
thoracic or lumbar spine.

## 2020-02-22 ENCOUNTER — Encounter: Payer: Self-pay | Admitting: Primary Care

## 2020-02-22 ENCOUNTER — Other Ambulatory Visit: Payer: Self-pay

## 2020-02-22 ENCOUNTER — Ambulatory Visit (INDEPENDENT_AMBULATORY_CARE_PROVIDER_SITE_OTHER): Payer: 59 | Admitting: Primary Care

## 2020-02-22 ENCOUNTER — Other Ambulatory Visit: Payer: Self-pay | Admitting: Primary Care

## 2020-02-22 DIAGNOSIS — J45909 Unspecified asthma, uncomplicated: Secondary | ICD-10-CM | POA: Insufficient documentation

## 2020-02-22 DIAGNOSIS — J4530 Mild persistent asthma, uncomplicated: Secondary | ICD-10-CM

## 2020-02-22 MED ORDER — PROAIR RESPICLICK 108 (90 BASE) MCG/ACT IN AEPB: 2.0000 | INHALATION_SPRAY | Freq: Four times a day (QID) | RESPIRATORY_TRACT | 3 refills | Status: DC | PRN

## 2020-02-22 MED ORDER — BUDESONIDE-FORMOTEROL FUMARATE 160-4.5 MCG/ACT IN AERO: 2.0000 | INHALATION_SPRAY | Freq: Two times a day (BID) | RESPIRATORY_TRACT | 11 refills | Status: DC

## 2020-02-22 NOTE — Progress Notes (Signed)
@Patient  ID: , male    DOB: 1995-05-14, 24 y.o.   MRN: [id number]  Chief Complaint  Patient presents with  . Follow-up    Asthma, worse in cold air with wheezing and cough    Referring provider: No ref. provider found  HPI: 24 year old male, current smoker. PMH significant for moderate persistent asthma. Former patient of Dr. 21, last seen on 04/17/18. Maintained on Symbicort 160, medication compliance has been an issue in the past.   02/22/2020 - Interim hx  Patient presents today for overdue follow-up for asthma/needing medication refill. He has been off Symbicort for the last year. He has noticed increase in cough and wheezing off inhaler. No shortness of breath. He is currently smoking 1/2 ppd. He has quit in the past. Declined covid and influenza vaccine.    DATA: 04/11/18: FVC: 6.27 > 6.50 L (96 > 100 %pred), FEV1: 4.15 > 5.04 L (76 > 92 %pred), FEV1/FVC: 66%, TLC: 7.53 L (95 %pred), DLCO 114 %pred   No Known Allergies   There is no immunization history on file for this patient.  History reviewed. No pertinent past medical history.  Tobacco History: Social History   Tobacco Use  Smoking Status Current Every Day Smoker  . Packs/day: 0.50  . Types: Cigarettes  Smokeless Tobacco Never Used   Ready to quit: No Counseling given: Yes   Outpatient Medications Prior to Visit  Medication Sig Dispense Refill  . Albuterol Sulfate (PROAIR RESPICLICK) 108 (90 Base) MCG/ACT AEPB Inhale 2 puffs into the lungs every 4 (four) hours as needed. 1 each 10  . budesonide-formoterol (SYMBICORT) 160-4.5 MCG/ACT inhaler      No facility-administered medications prior to visit.    Review of Systems  Review of Systems  Constitutional: Negative.   Respiratory: Positive for cough and wheezing. Negative for chest tightness and shortness of breath.    Physical Exam  BP 130/82 (BP Location: Left Arm, Cuff Size: Normal)   Pulse 90   Temp (!) 97.4 F (36.3 C) (Oral)    Ht 6\' 4"  (1.93 m)   Wt 224 lb 6.4 oz (101.8 kg)   SpO2 97%   BMI 27.31 kg/m  Physical Exam Constitutional:      Appearance: Normal appearance.  HENT:     Head: Normocephalic and atraumatic.     Mouth/Throat:     Comments: Deferred d/t mask Cardiovascular:     Rate and Rhythm: Normal rate and regular rhythm.  Pulmonary:     Effort: Pulmonary effort is normal.     Breath sounds: Normal breath sounds.  Musculoskeletal:        General: Normal range of motion.  Skin:    General: Skin is warm and dry.  Neurological:     General: No focal deficit present.     Mental Status: He is alert and oriented to person, place, and time. Mental status is at baseline.  Psychiatric:        Mood and Affect: Mood normal.        Behavior: Behavior normal.        Thought Content: Thought content normal.        Judgment: Judgment normal.      Lab Results:  CBC    Component Value Date/Time   WBC 11.6 (H) 09/22/2018 0931   RBC 5.34 09/22/2018 0931   HGB 16.6 09/22/2018 0931   HCT 47.9 09/22/2018 0931   PLT 270 09/22/2018 0931   MCV 89.7 09/22/2018 0931  MCH 31.1 09/22/2018 0931   MCHC 34.7 09/22/2018 0931   RDW 12.2 09/22/2018 0931   LYMPHSABS 4.2 (H) 09/22/2018 0931   MONOABS 0.7 09/22/2018 0931   EOSABS 0.3 09/22/2018 0931   BASOSABS 0.0 09/22/2018 0931    BMET    Component Value Date/Time   NA 143 09/22/2018 0931   K 3.7 09/22/2018 0931   CL 107 09/22/2018 0931   CO2 26 09/22/2018 0931   GLUCOSE 119 (H) 09/22/2018 0931   BUN 19 09/22/2018 0931   CREATININE 1.29 (H) 09/22/2018 0931   CALCIUM 10.2 09/22/2018 0931   GFRNONAA >60 09/22/2018 0931   GFRAA >60 09/22/2018 0931    BNP No results found for: BNP  ProBNP No results found for: PROBNP  Imaging: No results found.   Assessment & Plan:   Asthma - Patient reports increased cough and wheeze off ICS/LABA over the last year. No shortness of breath.  - Recommend restarting Symbicort 160 two puffs morning and  evening (Rx sent) - Continue albuterol 2 puffs every 6 hours for breakthrough symptoms  - Strongly encourage patient quit smoking - FU in 1 year with new pulmonary MD. Call office developa increased cough, shortness of breath, wheezing or chest tightness      Glenford Bayley, NP 02/22/2020

## 2020-02-22 NOTE — Patient Instructions (Addendum)
Recommendations: Restart Symbicort - take two puffs morning and evening (rinse mouth after use) Use albuterol 2 every 6 hours for breakthrough asthma symptoms  Strongly encourage you quit smoking, taper the amount and then pick quit date  Call office if you notice increased cough, shortness of breath, wheezing or chest tightness   RX: Symbicort 160  Follow-up: 1 year with Dr. Jayme Cloud in [facility] for asthma (new patient) OR sooner if needed    Asthma, Adult  Asthma is a long-term (chronic) condition in which the airways get tight and narrow. The airways are the breathing passages that lead from the nose and mouth down into the lungs. A person with asthma will have times when symptoms get worse. These are called asthma attacks. They can cause coughing, whistling sounds when you breathe (wheezing), shortness of breath, and chest pain. They can make it hard to breathe. There is no cure for asthma, but medicines and lifestyle changes can help control it. There are many things that can bring on an asthma attack or make asthma symptoms worse (triggers). Common triggers include:  Mold.  Dust.  Cigarette smoke.  Cockroaches.  Things that can cause allergy symptoms (allergens). These include animal skin flakes (dander) and pollen from trees or grass.  Things that pollute the air. These may include household cleaners, wood smoke, smog, or chemical odors.  Cold air, weather changes, and wind.  Crying or laughing hard.  Stress.  Certain medicines or drugs.  Certain foods such as dried fruit, potato chips, and grape juice.  Infections, such as a cold or the flu.  Certain medical conditions or diseases.  Exercise or tiring activities. Asthma may be treated with medicines and by staying away from the things that cause asthma attacks. Types of medicines may include:  Controller medicines. These help prevent asthma symptoms. They are usually taken every day.  Fast-acting reliever or  rescue medicines. These quickly relieve asthma symptoms. They are used as needed and provide short-term relief.  Allergy medicines if your attacks are brought on by allergens.  Medicines to help control the body's defense (immune) system. Follow these instructions at home: Avoiding triggers in your home  Change your heating and air conditioning filter often.  Limit your use of fireplaces and wood stoves.  Get rid of pests (such as roaches and mice) and their droppings.  Throw away plants if you see mold on them.  Clean your floors. Dust regularly. Use cleaning products that do not smell.  Have someone vacuum when you are not home. Use a vacuum cleaner with a HEPA filter if possible.  Replace carpet with wood, tile, or vinyl flooring. Carpet can trap animal skin flakes and dust.  Use allergy-proof pillows, mattress covers, and box spring covers.  Wash bed sheets and blankets every week in hot water. Dry them in a dryer.  Keep your bedroom free of any triggers.  Avoid pets and keep windows closed when things that cause allergy symptoms are in the air.  Use blankets that are made of polyester or cotton.  Clean bathrooms and kitchens with bleach. If possible, have someone repaint the walls in these rooms with mold-resistant paint. Keep out of the rooms that are being cleaned and painted.  Wash your hands often with soap and water. If soap and water are not available, use hand sanitizer.  Do not allow anyone to smoke in your home. General instructions  Take over-the-counter and prescription medicines only as told by your doctor. ? Talk with your  doctor if you have questions about how or when to take your medicines. ? Make note if you need to use your medicines more often than usual.  Do not use any products that contain nicotine or tobacco, such as cigarettes and e-cigarettes. If you need help quitting, ask your doctor.  Stay away from secondhand smoke.  Avoid doing things  outdoors when allergen counts are high and when air quality is low.  Wear a ski mask when doing outdoor activities in the winter. The mask should cover your nose and mouth. Exercise indoors on cold days if you can.  Warm up before you exercise. Take time to cool down after exercise.  Use a peak flow meter as told by your doctor. A peak flow meter is a tool that measures how well the lungs are working.  Keep track of the peak flow meter's readings. Write them down.  Follow your asthma action plan. This is a written plan for taking care of your asthma and treating your attacks.  Make sure you get all the shots (vaccines) that your doctor recommends. Ask your doctor about a flu shot and a pneumonia shot.  Keep all follow-up visits as told by your doctor. This is important. Contact a doctor if:  You have wheezing, shortness of breath, or a cough even while taking medicine to prevent attacks.  The mucus you cough up (sputum) is thicker than usual.  The mucus you cough up changes from clear or white to yellow, green, gray, or bloody.  You have problems from the medicine you are taking, such as: ? A rash. ? Itching. ? Swelling. ? Trouble breathing.  You need reliever medicines more than 2-3 times a week.  Your peak flow reading is still at 50-79% of your personal best after following the action plan for 1 hour.  You have a fever. Get help right away if:  You seem to be worse and are not responding to medicine during an asthma attack.  You are short of breath even at rest.  You get short of breath when doing very little activity.  You have trouble eating, drinking, or talking.  You have chest pain or tightness.  You have a fast heartbeat.  Your lips or fingernails start to turn blue.  You are light-headed or dizzy, or you faint.  Your peak flow is less than 50% of your personal best.  You feel too tired to breathe normally. Summary  Asthma is a long-term (chronic)  condition in which the airways get tight and narrow. An asthma attack can make it hard to breathe.  Asthma cannot be cured, but medicines and lifestyle changes can help control it.  Make sure you understand how to avoid triggers and how and when to use your medicines. This information is not intended to replace advice given to you by your health care provider. Make sure you discuss any questions you have with your health care provider. Document Revised: 05/04/2018 Document Reviewed: 04/05/2016 Elsevier Patient Education  2020 [employer].

## 2020-02-22 NOTE — Assessment & Plan Note (Addendum)
-   Patient reports increased cough and wheeze off ICS/LABA over the last year. No shortness of breath.  - Recommend restarting Symbicort 160 two puffs morning and evening (Rx sent) - Continue albuterol 2 puffs every 6 hours for breakthrough symptoms  - Strongly encourage patient quit smoking - FU in 1 year with new pulmonary MD. Call office developa increased cough, shortness of breath, wheezing or chest tightness

## 2020-02-25 ENCOUNTER — Other Ambulatory Visit: Payer: Self-pay | Admitting: Pulmonary Disease

## 2020-02-25 MED ORDER — ALBUTEROL SULFATE HFA 108 (90 BASE) MCG/ACT IN AERS: 2.0000 | INHALATION_SPRAY | Freq: Four times a day (QID) | RESPIRATORY_TRACT | 2 refills | Status: DC | PRN

## 2020-08-05 ENCOUNTER — Emergency Department
Admission: EM | Admit: 2020-08-05 | Discharge: 2020-08-05 | Disposition: A | Discharge status: Home / Self Care | Payer: 59 | Attending: Emergency Medicine | Admitting: Emergency Medicine

## 2020-08-05 ENCOUNTER — Other Ambulatory Visit: Payer: Self-pay

## 2020-08-05 ENCOUNTER — Emergency Department: Payer: 59

## 2020-08-05 DIAGNOSIS — M25511 Pain in right shoulder: Secondary | ICD-10-CM | POA: Diagnosis not present

## 2020-08-05 DIAGNOSIS — S0990XA Unspecified injury of head, initial encounter: Secondary | ICD-10-CM

## 2020-08-05 DIAGNOSIS — J45909 Unspecified asthma, uncomplicated: Secondary | ICD-10-CM | POA: Diagnosis not present

## 2020-08-05 DIAGNOSIS — F17210 Nicotine dependence, cigarettes, uncomplicated: Secondary | ICD-10-CM | POA: Insufficient documentation

## 2020-08-05 DIAGNOSIS — S0003XA Contusion of scalp, initial encounter: Secondary | ICD-10-CM | POA: Insufficient documentation

## 2020-08-05 DIAGNOSIS — W11XXXA Fall on and from ladder, initial encounter: Secondary | ICD-10-CM | POA: Insufficient documentation

## 2020-08-05 DIAGNOSIS — W19XXXA Unspecified fall, initial encounter: Secondary | ICD-10-CM

## 2020-08-05 NOTE — ED Notes (Signed)
See triage note  Presents s/p fall from ladder  States he thinks he fell approx 6 ft  Landed on right shoulder/arm and hit his head  No LOC

## 2020-08-05 NOTE — ED Triage Notes (Signed)
Attempted to place C-Collar on patient. Patient refused.

## 2020-08-05 NOTE — ED Triage Notes (Signed)
First Nurse Note:  Arrives with father, patient fell approximately 7-8 feet onto concrete.  + LOC.

## 2020-08-05 NOTE — ED Triage Notes (Addendum)
Pt comes with c/o fall while at work. Pt denies any WC filing. Pt states he fell off ladder 6-7 feet landing on right side. Pt states he hit his head on the right side onto concrete. Pt states LOC. Pt states headache 9/10 pain at this time.  Pt denies any other complaints.  Dad with pt and states pt needs drug test. 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   Pt denies any drug use.

## 2020-08-05 NOTE — ED Provider Notes (Signed)
[facility] Emergency Department Provider Note   ____________________________________________   Event Date/Time   First MD Initiated Contact with Patient 08/05/20 1808     (approximate)  I have reviewed the triage vital signs and the nursing notes.   HISTORY  Chief Complaint Fall    HPI Arthur Guerra is a 25 y.o. male with no significant past medical history who presents to the ED complaining of fall.  Patient reports that he was working in a garage and standing on a 6 foot ladder when the ladder suddenly gave out on him.  He fell to his right side and hit the side of his head on the concrete floor of the garage.  He believes he lost consciousness for a few seconds and now complains of pain to the right side of his head.  He denies any neck pain, does endorse pain in his right shoulder but states he is able to move his right arm without difficulty.  He denies any vision changes, speech changes, numbness, or weakness.  He does not take any blood thinners.  He denies any pain in his chest, abdomen, or other extremities.        History reviewed. No pertinent past medical history.  Patient Active Problem List   Diagnosis Date Noted  . Asthma 02/22/2020    Past Surgical History:  Procedure Laterality Date  . BACK SURGERY      Prior to Admission medications   Medication Sig Start Date End Date Taking? Authorizing Provider  albuterol (VENTOLIN HFA) 108 (90 Base) MCG/ACT inhaler Inhale 2 puffs into the lungs every 6 (six) hours as needed for wheezing or shortness of breath. 02/25/20   Coral Ceo, NP  budesonide-formoterol (SYMBICORT) 160-4.5 MCG/ACT inhaler Inhale 2 puffs into the lungs in the morning and at bedtime. 02/22/20   Glenford Bayley, NP  PROAIR RESPICLICK 108 [phone] Base) MCG/ACT AEPB TAKE 2 PUFFS BY MOUTH EVERY 6 HOURS AS NEEDED 02/25/20   Coral Ceo, NP    Allergies Patient has no known allergies.  Family History  Problem Relation Age  of Onset  . Pneumonia Paternal Grandmother   . Asthma Paternal Grandmother     Social History Social History   Tobacco Use  . Smoking status: Current Every Day Smoker    Packs/day: 0.50    Types: Cigarettes  . Smokeless tobacco: Never Used  Vaping Use  . Vaping Use: Never used  Substance Use Topics  . Alcohol use: No    Comment: soc  . Drug use: No    Review of Systems  Constitutional: No fever/chills Eyes: No visual changes. ENT: No sore throat. Cardiovascular: Denies chest pain. Respiratory: Denies shortness of breath. Gastrointestinal: No abdominal pain.  No nausea, no vomiting.  No diarrhea.  No constipation. Genitourinary: Negative for dysuria. Musculoskeletal: Negative for back pain.  Positive for right shoulder pain. Skin: Negative for rash. Neurological: Positive for headaches, negative for focal weakness or numbness.  ____________________________________________   PHYSICAL EXAM:  VITAL SIGNS: ED Triage Vitals  Enc Vitals Group     BP 08/05/20 1616 (!) 136/104     Pulse Rate 08/05/20 1616 98     Resp 08/05/20 1616 18     Temp 08/05/20 1616 98 F (36.7 C)     Temp src --      SpO2 08/05/20 1616 98 %     Weight --      Height --      Head Circumference --  Peak Flow --      Pain Score 08/05/20 1617 9     Pain Loc --      Pain Edu? --      Excl. in GC? --     Constitutional: Alert and oriented. Eyes: Conjunctivae are normal. Head: Small hematoma to right parietal scalp noted.  No lacerations or step-offs noted. Nose: No congestion/rhinnorhea. Mouth/Throat: Mucous membranes are moist. Neck: Normal ROM, no midline cervical spine tenderness to palpation. Cardiovascular: Normal rate, regular rhythm. Grossly normal heart sounds. Respiratory: Normal respiratory effort.  No retractions. Lungs CTAB. Gastrointestinal: Soft and nontender. No distention. Genitourinary: deferred Musculoskeletal: No lower extremity tenderness nor edema.  No tenderness  to bilateral upper extremities. Neurologic:  Normal speech and language. No gross focal neurologic deficits are appreciated. Skin:  Skin is warm, dry and intact. No rash noted. Psychiatric: Mood and affect are normal. Speech and behavior are normal.  ____________________________________________   LABS (all labs ordered are listed, but only abnormal results are displayed)  Labs Reviewed - No data to display ____________________________________________  EKG  ED ECG REPORT I, Chesley Noon, the attending physician, personally viewed and interpreted this ECG.   Date: 08/05/2020  EKG Time: 16:18  Rate: 82  Rhythm: normal sinus rhythm  Axis: Normal  Intervals:none  ST&T Change: None   PROCEDURES  Procedure(s) performed (including Critical Care):  Procedures   ____________________________________________   INITIAL IMPRESSION / ASSESSMENT AND PLAN / ED COURSE       25 year old male with no significant past medical history presents to the ED with headache and right shoulder pain after fall off of a 6 foot ladder.  He reports LOC and CT head as well as cervical spine were performed.  CT head reviewed by me with no obvious hemorrhage, negative for acute process per radiology.  CT cervical spine is also negative for acute process.  Patient reports some right shoulder pain but has no pain upon palpation or range of motion at his right shoulder.  He is neurovascularly intact to his distal right upper extremity, no other evidence of traumatic injury to his extremities.  He is appropriate for discharge home with PCP follow-up, was counseled to return to the ED for new or worsening symptoms.  Patient agrees with plan.      ____________________________________________   FINAL CLINICAL IMPRESSION(S) / ED DIAGNOSES  Final diagnoses:  Fall, initial encounter  Injury of head, initial encounter     ED Discharge Orders    None       Note:  This document was prepared using  Dragon voice recognition software and may include unintentional dictation errors.   Chesley Noon, MD 08/05/20 [phone]

## 2020-08-08 ENCOUNTER — Encounter: Payer: Self-pay | Admitting: Emergency Medicine

## 2020-08-08 ENCOUNTER — Other Ambulatory Visit: Payer: Self-pay

## 2020-08-08 DIAGNOSIS — J45909 Unspecified asthma, uncomplicated: Secondary | ICD-10-CM | POA: Diagnosis not present

## 2020-08-08 DIAGNOSIS — W2209XA Striking against other stationary object, initial encounter: Secondary | ICD-10-CM | POA: Insufficient documentation

## 2020-08-08 DIAGNOSIS — Z7951 Long term (current) use of inhaled steroids: Secondary | ICD-10-CM | POA: Diagnosis not present

## 2020-08-08 DIAGNOSIS — S62316A Displaced fracture of base of fifth metacarpal bone, right hand, initial encounter for closed fracture: Secondary | ICD-10-CM | POA: Insufficient documentation

## 2020-08-08 DIAGNOSIS — S62314A Displaced fracture of base of fourth metacarpal bone, right hand, initial encounter for closed fracture: Secondary | ICD-10-CM | POA: Diagnosis not present

## 2020-08-08 DIAGNOSIS — F17210 Nicotine dependence, cigarettes, uncomplicated: Secondary | ICD-10-CM | POA: Insufficient documentation

## 2020-08-08 DIAGNOSIS — S6991XA Unspecified injury of right wrist, hand and finger(s), initial encounter: Secondary | ICD-10-CM | POA: Diagnosis present

## 2020-08-08 NOTE — ED Triage Notes (Signed)
Pt presents to ER from home reports he has been drinking and punched a steel door with right hand. Pt talks in complete sentences no respiratory distress noted.

## 2020-08-09 ENCOUNTER — Encounter: Payer: Self-pay | Admitting: Radiology

## 2020-08-09 ENCOUNTER — Emergency Department: Payer: 59

## 2020-08-09 ENCOUNTER — Emergency Department
Admission: EM | Admit: 2020-08-09 | Discharge: 2020-08-09 | Disposition: A | Discharge status: Home / Self Care | Payer: 59 | Attending: Emergency Medicine | Admitting: Emergency Medicine

## 2020-08-09 DIAGNOSIS — S62319A Displaced fracture of base of unspecified metacarpal bone, initial encounter for closed fracture: Secondary | ICD-10-CM

## 2020-08-09 MED ORDER — OXYCODONE-ACETAMINOPHEN 5-325 MG PO TABS
1.0000 | ORAL_TABLET | Freq: Once | ORAL | Status: AC
Start: 2020-08-09 — End: 2020-08-09
  Administered 2020-08-09: 1 via ORAL
  Filled 2020-08-09: qty 1

## 2020-08-09 MED ORDER — IBUPROFEN 800 MG PO TABS: 800.0000 mg | ORAL_TABLET | Freq: Three times a day (TID) | ORAL | 0 refills | Status: AC | PRN

## 2020-08-09 NOTE — Discharge Instructions (Signed)
As we discussed, your x-ray showed a fracture and dislocation.  The dislocation was corrected in the emergency room but you need to keep the splint on until you see orthopedics for repeat x-ray to see how the fracture is healing.  Keep the splint dry and clean.  Do not remove it.  Take ibuprofen or Tylenol for pain.  Call [facility] first thing Monday morning for an appointment.

## 2020-08-09 NOTE — ED Provider Notes (Signed)
[facility] Emergency Department Provider Note  ____________________________________________  Time seen: Approximately 2:57 AM  I have reviewed the triage vital signs and the nursing notes.   HISTORY  Chief Complaint Hand Pain   HPI Arthur Guerra is a 25 y.o. male right-handed who presents for evaluation of right hand pain.  Patient reports that he was drinking earlier today and punched a steel door with the right hand.  He is complaining of pain over the right fourth and fifth metacarpal regions.  There is swelling.  Obvious close deformity with intact distal brisk capillary refill.  No other injuries   History reviewed. No pertinent past medical history.  Patient Active Problem List   Diagnosis Date Noted  . Asthma 02/22/2020    Past Surgical History:  Procedure Laterality Date  . BACK SURGERY      Prior to Admission medications   Medication Sig Start Date End Date Taking? Authorizing Provider  ibuprofen (ADVIL) 800 MG tablet Take 1 tablet (800 mg total) by mouth every 8 (eight) hours as needed. 08/09/20  Yes Don Perking, [place], MD  albuterol (VENTOLIN HFA) 108 (90 Base) MCG/ACT inhaler Inhale 2 puffs into the lungs every 6 (six) hours as needed for wheezing or shortness of breath. 02/25/20   Coral Ceo, NP  budesonide-formoterol (SYMBICORT) 160-4.5 MCG/ACT inhaler Inhale 2 puffs into the lungs in the morning and at bedtime. 02/22/20   Glenford Bayley, NP  PROAIR RESPICLICK 108 [phone] Base) MCG/ACT AEPB TAKE 2 PUFFS BY MOUTH EVERY 6 HOURS AS NEEDED 02/25/20   Coral Ceo, NP    Allergies Patient has no known allergies.  Family History  Problem Relation Age of Onset  . Pneumonia Paternal Grandmother   . Asthma Paternal Grandmother     Social History Social History   Tobacco Use  . Smoking status: Current Every Day Smoker    Packs/day: 0.50    Types: Cigarettes  . Smokeless tobacco: Never Used  Vaping Use  . Vaping Use: Never used   Substance Use Topics  . Alcohol use: No    Comment: soc  . Drug use: No    Review of Systems  Constitutional: Negative for fever. Eyes: Negative for visual changes. ENT: Negative for sore throat. Neck: No neck pain  Cardiovascular: Negative for chest pain. Respiratory: Negative for shortness of breath. Gastrointestinal: Negative for abdominal pain, vomiting or diarrhea. Genitourinary: Negative for dysuria. Musculoskeletal: Negative for back pain. + R hand pain Skin: Negative for rash. Neurological: Negative for headaches, weakness or numbness. Psych: No SI or HI  ____________________________________________   PHYSICAL EXAM:  VITAL SIGNS: ED Triage Vitals [08/08/20 2342]  Enc Vitals Group     BP (!) 142/110     Pulse Rate (!) 105     Resp 20     Temp 98.5 F (36.9 C)     Temp Source Oral     SpO2 95 %     Weight 220 lb (99.8 kg)     Height 6\' 3"  (1.905 m)     Head Circumference      Peak Flow      Pain Score 7     Pain Loc      Pain Edu?      Excl. in GC?     Constitutional: Alert and oriented. Well appearing and in no apparent distress. HEENT:      Head: Normocephalic and atraumatic.         Eyes: Conjunctivae are normal.  Sclera is non-icteric.       Mouth/Throat: Mucous membranes are moist.       Neck: Supple with no signs of meningismus. Cardiovascular: Regular rate and rhythm.  Respiratory: Normal respiratory effort.  Musculoskeletal: Swelling and close deformity of the right proximal fourth and fifth metacarpal with intact distal perfusion  Neurologic: Normal speech and language. Face is symmetric. Moving all extremities. No gross focal neurologic deficits are appreciated. Skin: Skin is warm, dry and intact. No rash noted. Psychiatric: Mood and affect are normal. Speech and behavior are normal.  ____________________________________________   LABS (all labs ordered are listed, but only abnormal results are displayed)  Labs Reviewed - No data to  display ____________________________________________  EKG  none  ____________________________________________  RADIOLOGY  I have personally reviewed the images performed during this visit and I agree with the Radiologist's read.   Interpretation by Radiologist:  DG Hand 2 View Right  Result Date: 08/09/2020 CLINICAL DATA:  Post reduction EXAM: RIGHT HAND - 2 VIEW COMPARISON:  08/09/2020 FINDINGS: Interval reduction of previously noted dislocation at the fourth and fifth CMC joints. Small intra-articular fracture at the base of fifth metacarpal. Positive for soft tissue swelling IMPRESSION: 1. Reduction of previously noted dislocation at fourth and fifth CMC joints 2. Small intra-articular fracture base of fifth metacarpal Electronically Signed   By: Jasmine Pang M.D.   On: 08/09/2020 03:16   DG Hand Complete Right  Result Date: 08/09/2020 CLINICAL DATA:  Punched a steel door, pain EXAM: RIGHT HAND - COMPLETE 3+ VIEW COMPARISON:  None. FINDINGS: Frontal, oblique, and lateral views of the right hand are obtained. There is dorsal dislocation of the fourth and fifth metacarpals at the level of the carpal metacarpal joints. Small fracture is identified at the base of the fifth metacarpal. Marked dorsal and ulnar soft tissue swelling. IMPRESSION: 1. Fracture dislocations at the base of the fourth and fifth metacarpals as above. Electronically Signed   By: Sharlet Salina M.D.   On: 08/09/2020 00:33     ____________________________________________   PROCEDURES  Procedure(s) performed:yes .Ortho Injury Treatment  Date/Time: 08/09/2020 2:59 AM Performed by: Nita Sickle, MD Authorized by: Nita Sickle, MD   Consent:    Consent obtained:  Verbal   Consent given by:  Patient   Risks discussed:  Fracture, irreducible dislocation, recurrent dislocation, nerve damage, restricted joint movement, stiffness and vascular damage   Alternatives discussed:  Immobilization and  referralInjury location: hand Location details: right hand Injury type: fracture-dislocation Pre-procedure neurovascular assessment: neurovascularly intact Pre-procedure distal perfusion: normal Pre-procedure neurological function: normal Pre-procedure range of motion: reduced  Anesthesia: Local anesthesia used: no  Patient sedated: NoManipulation performed: yes Skeletal traction used: yes Reduction successful: yes X-ray confirmed reduction: yes Immobilization: splint Splint type: volar short arm Splint Applied by: ED Tech Supplies used: cotton padding,  elastic bandage and Ortho-Glass Post-procedure neurovascular assessment: post-procedure neurovascularly intact Post-procedure distal perfusion: normal Post-procedure neurological function: normal Post-procedure range of motion: improved    Critical Care performed:  None ____________________________________________   INITIAL IMPRESSION / ASSESSMENT AND PLAN / ED COURSE  25 y.o. male right-handed who presents for evaluation of right hand pain.  X-ray consistent with a fracture dislocation at the base of the fourth and fifth metacarpals.  Dislocation was reduced by me and confirmed by x-ray.  Patient placed on a short arm splint and referral to orthopedics for further evaluation.  Discussed my standard return precautions, splint care and follow-up with patient  _____________________________________________ Please note:  Patient was evaluated in Emergency Department today for the symptoms described in the history of present illness. Patient was evaluated in the context of the global COVID-19 pandemic, which necessitated consideration that the patient might be at risk for infection with the SARS-CoV-2 virus that causes COVID-19. Institutional protocols and algorithms that pertain to the evaluation of patients at risk for COVID-19 are in a state of rapid change based on information released by regulatory bodies including the CDC  and federal and state organizations. These policies and algorithms were followed during the patient's care in the ED.  Some ED evaluations and interventions may be delayed as a result of limited staffing during the pandemic.   [place] Controlled Substance Database was reviewed by me. ____________________________________________   FINAL CLINICAL IMPRESSION(S) / ED DIAGNOSES   Final diagnoses:  Closed displaced fracture of base of metacarpal bone, unspecified fracture morphology, unspecified metacarpal, initial encounter      NEW MEDICATIONS STARTED DURING THIS VISIT:  ED Discharge Orders         Ordered    ibuprofen (ADVIL) 800 MG tablet  Every 8 hours PRN        08/09/20 0327           Note:  This document was prepared using Dragon voice recognition software and may include unintentional dictation errors.    Don Perking, [place], MD 08/09/20 [phone]

## 2020-08-18 ENCOUNTER — Other Ambulatory Visit: Payer: Self-pay | Admitting: Orthopaedic Surgery

## 2020-08-18 DIAGNOSIS — S63264A Dislocation of metacarpophalangeal joint of right ring finger, initial encounter: Secondary | ICD-10-CM

## 2020-08-29 ENCOUNTER — Ambulatory Visit: Payer: 59 | Attending: Orthopaedic Surgery

## 2020-09-16 ENCOUNTER — Other Ambulatory Visit: Payer: Self-pay | Admitting: Family Medicine

## 2020-09-16 ENCOUNTER — Ambulatory Visit (INDEPENDENT_AMBULATORY_CARE_PROVIDER_SITE_OTHER): Payer: 59 | Admitting: Family Medicine

## 2020-09-16 ENCOUNTER — Other Ambulatory Visit: Payer: Self-pay

## 2020-09-16 ENCOUNTER — Encounter: Payer: Self-pay | Admitting: Family Medicine

## 2020-09-16 VITALS — BP 128/80 | HR 108 | Ht 74.0 in | Wt 185.4 lb

## 2020-09-16 DIAGNOSIS — Z720 Tobacco use: Secondary | ICD-10-CM | POA: Insufficient documentation

## 2020-09-16 DIAGNOSIS — Z981 Arthrodesis status: Secondary | ICD-10-CM | POA: Insufficient documentation

## 2020-09-16 DIAGNOSIS — F1121 Opioid dependence, in remission: Secondary | ICD-10-CM

## 2020-09-16 DIAGNOSIS — Z0000 Encounter for general adult medical examination without abnormal findings: Secondary | ICD-10-CM

## 2020-09-16 DIAGNOSIS — Z7689 Persons encountering health services in other specified circumstances: Secondary | ICD-10-CM | POA: Diagnosis not present

## 2020-09-16 DIAGNOSIS — M545 Low back pain, unspecified: Secondary | ICD-10-CM

## 2020-09-16 DIAGNOSIS — Z1159 Encounter for screening for other viral diseases: Secondary | ICD-10-CM

## 2020-09-16 DIAGNOSIS — J4540 Moderate persistent asthma, uncomplicated: Secondary | ICD-10-CM | POA: Insufficient documentation

## 2020-09-16 DIAGNOSIS — Z114 Encounter for screening for human immunodeficiency virus [HIV]: Secondary | ICD-10-CM

## 2020-09-16 DIAGNOSIS — G8929 Other chronic pain: Secondary | ICD-10-CM

## 2020-09-16 MED ORDER — ADVAIR DISKUS 250-50 MCG/ACT IN AEPB: 1.0000 | INHALATION_SPRAY | Freq: Two times a day (BID) | RESPIRATORY_TRACT | 5 refills | Status: AC

## 2020-09-16 MED ORDER — ALBUTEROL SULFATE HFA 108 (90 BASE) MCG/ACT IN AERS: 2.0000 | INHALATION_SPRAY | Freq: Four times a day (QID) | RESPIRATORY_TRACT | 2 refills | Status: AC | PRN

## 2020-09-16 NOTE — Progress Notes (Signed)
Subjective:    Patient ID: Arthur Guerra, male    DOB: 10/25/1995, 25 y.o.   MRN: [id number]  Arthur Guerra is a 25 y.o. male presenting on 09/16/2020 for Establish Care / Back Pain / Asthma  Here for new patient evaluation.  HPI  Asthma,  He joined [employer] for 4 years, he developed some respiratory symptoms, later was dx with pneumonia multiple times in 1 year. He saw lung specialist and dx with Asthma.  History of Opiate Abuse He reports he used in high school temporarily  2020 back fracture, and was placed  - [place] off roof at work, he works [employer] representative. He was managed by [facility] Neurosurgery Dr Teola Bradley, and ultimately was on medication for pain and had surgery with spinal fusion T10-L2. - He still has chronic mid to low back pain. - He was not planning to pursue spinal injections.  He was on percocet since 2020. He was on high dose percocet, not prescribed. He had some issue with urinary difficulties, poor PO intake, weight loss. He has gradually weaned off  Some improvement since   He has family support and he is living at parents house right now. He has done well despite withdrawals.  Now 2-3 weeks off after cold [place].  He admits fatigue, and insomnia at times Tries Vicks Zquil and some Melatonin.   Health Maintenance: Return to discuss wellness/physical  Depression screen [facility] 2/9 09/16/2020  Decreased Interest 1  Down, Depressed, Hopeless 0  PHQ - 2 Score 1  Altered sleeping 3  Tired, decreased energy 3  Change in appetite 1  Feeling bad or failure about yourself  0  Trouble concentrating 0  Moving slowly or fidgety/restless 1  Suicidal thoughts 0  PHQ-9 Score 9  Difficult doing work/chores Somewhat difficult   GAD 7 : Generalized Anxiety Score 09/16/2020  Nervous, Anxious, on Edge 0  Control/stop worrying 1  Worry too much - different things 1  Trouble relaxing 0  Restless 0  Easily annoyed or irritable 1  Afraid - awful might happen 0  Total GAD 7 Score 3   Anxiety Difficulty Somewhat difficult     Past Medical History:  Diagnosis Date   Asthma    Past Surgical History:  Procedure Laterality Date   BACK SURGERY     Social History   Socioeconomic History   Marital status: Single    Spouse name: Not on file   Number of children: Not on file   Years of education: Not on file   Highest education level: Not on file  Occupational History   Not on file  Tobacco Use   Smoking status: Every Day    Packs/day: 0.50    Years: 5.00    Pack years: 2.50    Types: Cigarettes   Smokeless tobacco: Never  Vaping Use   Vaping Use: Never used  Substance and Sexual Activity   Alcohol use: No    Comment: soc   Drug use: No   Sexual activity: Yes  Other Topics Concern   Not on file  Social History Narrative   Not on file   Social Determinants of Health   Financial Resource Strain: Not on file  Food Insecurity: Not on file  Transportation Needs: Not on file  Physical Activity: Not on file  Stress: Not on file  Social Connections: Not on file  Intimate Partner Violence: Not on file   Family History  Problem Relation Age of Onset   Hypertension Father  Pneumonia Paternal Grandmother    Asthma Paternal Grandmother    Current Outpatient Medications on File Prior to Visit  Medication Sig   ibuprofen (ADVIL) 800 MG tablet Take 1 tablet (800 mg total) by mouth every 8 (eight) hours as needed.   omeprazole (PRILOSEC OTC) 20 MG tablet Take 20 mg by mouth as needed.   No current facility-administered medications on file prior to visit.    Review of Systems Per HPI unless specifically indicated above     Objective:    BP 128/80 (BP Location: Left Arm, Cuff Size: Normal)   Pulse (!) 108   Ht 6\' 2"  (1.88 m)   Wt 185 lb 6.4 oz (84.1 kg)   SpO2 99%   BMI 23.80 kg/m   Wt Readings from Last 3 Encounters:  09/16/20 185 lb 6.4 oz (84.1 kg)  08/08/20 220 lb (99.8 kg)  08/05/20 224 lb 6.9 oz (101.8 kg)    Physical Exam Vitals  and nursing note reviewed.  Constitutional:      General: He is not in acute distress.    Appearance: Normal appearance. He is well-developed. He is not diaphoretic.     Comments: Well-appearing, comfortable, cooperative  HENT:     Head: Normocephalic and atraumatic.  Eyes:     General:        Right eye: No discharge.        Left eye: No discharge.     Conjunctiva/sclera: Conjunctivae normal.  Cardiovascular:     Rate and Rhythm: Normal rate.  Pulmonary:     Effort: Pulmonary effort is normal.  Skin:    General: Skin is warm and dry.     Findings: No erythema or rash.  Neurological:     Mental Status: He is alert and oriented to person, place, and time.  Psychiatric:        Mood and Affect: Mood normal.        Behavior: Behavior normal.        Thought Content: Thought content normal.     Comments: Well groomed, good eye contact, normal speech and thoughts     Results for orders placed or performed during the hospital encounter of 09/22/18  SARS Coronavirus 2 (CEPHEID - Performed in [facility] hospital lab), [facility]   Specimen: Nasopharyngeal Swab  Result Value Ref Range   SARS Coronavirus 2 NEGATIVE NEGATIVE  CBC with Differential  Result Value Ref Range   WBC 11.6 (H) 4.0 - 10.5 K/uL   RBC 5.34 4.22 - 5.81 MIL/uL   Hemoglobin 16.6 13.0 - 17.0 g/dL   HCT 16.147.9 09.639.0 - 04.552.0 %   MCV 89.7 80.0 - 100.0 fL   MCH 31.1 26.0 - 34.0 pg   MCHC 34.7 30.0 - 36.0 g/dL   RDW 40.912.2 81.111.5 - 91.415.5 %   Platelets 270 150 - 400 K/uL   nRBC 0.0 0.0 - 0.2 %   Neutrophils Relative % 55 %   Neutro Abs 6.2 1.7 - 7.7 K/uL   Lymphocytes Relative 36 %   Lymphs Abs 4.2 (H) 0.7 - 4.0 K/uL   Monocytes Relative 6 %   Monocytes Absolute 0.7 0.1 - 1.0 K/uL   Eosinophils Relative 2 %   Eosinophils Absolute 0.3 0.0 - 0.5 K/uL   Basophils Relative 0 %   Basophils Absolute 0.0 0.0 - 0.1 K/uL   Immature Granulocytes 1 %   Abs Immature Granulocytes 0.13 (H) 0.00 - 0.07 K/uL  Comprehensive metabolic  panel  Result Value  Ref Range   Sodium 143 135 - 145 mmol/L   Potassium 3.7 3.5 - 5.1 mmol/L   Chloride 107 98 - 111 mmol/L   CO2 26 22 - 32 mmol/L   Glucose, Bld 119 (H) 70 - 99 mg/dL   BUN 19 6 - 20 mg/dL   Creatinine, Ser 9.03 (H) 0.61 - 1.24 mg/dL   Calcium 00.9 8.9 - 23.3 mg/dL   Total Protein 7.5 6.5 - 8.1 g/dL   Albumin 4.8 3.5 - 5.0 g/dL   AST 44 (H) 15 - 41 U/L   ALT 49 (H) 0 - 44 U/L   Alkaline Phosphatase 67 38 - 126 U/L   Total Bilirubin 0.5 0.3 - 1.2 mg/dL   GFR calc non Af Amer >60 >60 mL/min   GFR calc Af Amer >60 >60 mL/min   Anion gap 10 5 - 15  APTT  Result Value Ref Range   aPTT 25 24 - 36 seconds  Protime-INR  Result Value Ref Range   Prothrombin Time 13.3 11.4 - 15.2 seconds   INR 1.0 0.8 - 1.2  Urine Drug Screen, Qualitative ([facility] only)  Result Value Ref Range   Tricyclic, Ur Screen NONE DETECTED NONE DETECTED   Amphetamines, Ur Screen NONE DETECTED NONE DETECTED   MDMA (Ecstasy)Ur Screen NONE DETECTED NONE DETECTED   Cocaine Metabolite,Ur [place] POSITIVE (A) NONE DETECTED   Opiate, Ur Screen POSITIVE (A) NONE DETECTED   Phencyclidine (PCP) Ur S NONE DETECTED NONE DETECTED   Cannabinoid 50 Ng, Ur [place] POSITIVE (A) NONE DETECTED   Barbiturates, Ur Screen NONE DETECTED NONE DETECTED   Benzodiazepine, Ur Scrn NONE DETECTED NONE DETECTED   Methadone Scn, Ur NONE DETECTED NONE DETECTED  Sample to Blood Bank  Result Value Ref Range   Blood Bank Specimen SAMPLE AVAILABLE FOR TESTING    Sample Expiration      09/25/2018,2359 Performed at [facility], [address] [place], [place] [address]       Assessment & Plan:   Problem List Items Addressed This Visit     Tobacco abuse   S/P laminectomy with spinal fusion   Opioid dependence in remission (HCC) - Primary   Moderate persistent asthma without complication   Relevant Medications   ADVAIR DISKUS 250-50 MCG/ACT AEPB   albuterol (VENTOLIN HFA) 108 (90 Base) MCG/ACT inhaler   Chronic  bilateral low back pain without sciatica   Other Visit Diagnoses     Encounter to establish care with new doctor           Establish care Review prior records.  Opiate Dependence in remission Off opiates for past 3+ weeks, has completed most of withdrawal window still experiencing some symptoms. Improving now. Still has some difficulty with urination and constipation. Remain off opiates Offer psychology substance therapy options he declines now, has good support system with family now  Moderate persistent asthma Chronic problem Tobacco abuse smoker contributing DC Symbicort too costly not formulary - Order Advair brand 1puff BID for maintenance, if needed Albuterol PRN  Chronic Low Back Pain S/p Spine Surgery Fusion, Thoracic/Lumbar laminectomy fusion T10-L2 Previous Neurosurgery Dr Teola Bradley Future plans consider refer to pain management for non-opioid management if/when ready   Meds ordered this encounter  Medications   ADVAIR DISKUS 250-50 MCG/ACT AEPB    Sig: Inhale 1 puff into the lungs in the morning and at bedtime.    Dispense:  60 each    Refill:  5   albuterol (VENTOLIN HFA) 108 (90  Base) MCG/ACT inhaler    Sig: Inhale 2 puffs into the lungs every 6 (six) hours as needed for wheezing or shortness of breath.    Dispense:  8 g    Refill:  2     Follow up plan: Return in about 4 weeks (around 10/14/2020) for 4 weeks fasting lab only then 1 week later Annual Physical.  Future labs ordered CMET, CBC, Lipid, HIV, Hep C, TSH  Saralyn Pilar, DO [facility] 09/16/2020, 3:22 PM

## 2020-09-16 NOTE — Patient Instructions (Addendum)
Thank you for coming to the office today.  Switched Symbicort to the Advair inhaler, 1 puff twice a day for maintenance. If you want to pursue this inhaler as a maintenance, to prevent flare ups  Definitely get the Albuterol to use as needed.  Future pain management referral if/when ready to pursue further management of the chronic low back pain.  Future consider taper down and quit smoking eventually.  DUE for FASTING BLOOD WORK (no food or drink after midnight before the lab appointment, only water or coffee without cream/sugar on the morning of)  SCHEDULE "Lab Only" visit in the morning at the clinic for lab draw in 4 WEEKS   - Make sure Lab Only appointment is at about 1 week before your next appointment, so that results will be available  For Lab Results, once available within 2-3 days of blood draw, you can can log in to MyChart online to view your results and a brief explanation. Also, we can discuss results at next follow-up visit.  Please schedule a Follow-up Appointment to: Return in about 4 weeks (around 10/14/2020) for 4 weeks fasting lab only then 1 week later Annual Physical.  If you have any other questions or concerns, please feel free to call the office or send a message through MyChart. You may also schedule an earlier appointment if necessary.  Additionally, you may be receiving a survey about your experience at our office within a few days to 1 week by e-mail or mail. We value your feedback.  Saralyn Pilar, DO [facility], [place]

## 2020-09-23 ENCOUNTER — Ambulatory Visit
Admission: RE | Admit: 2020-09-23 | Discharge: 2020-09-23 | Disposition: A | Discharge status: Home / Self Care | Payer: 59 | Source: Ambulatory Visit | Attending: Orthopaedic Surgery | Admitting: Orthopaedic Surgery

## 2020-09-23 ENCOUNTER — Other Ambulatory Visit: Payer: Self-pay

## 2020-09-23 DIAGNOSIS — S63264A Dislocation of metacarpophalangeal joint of right ring finger, initial encounter: Secondary | ICD-10-CM | POA: Insufficient documentation

## 2020-10-08 ENCOUNTER — Other Ambulatory Visit: Payer: 59

## 2020-10-08 DIAGNOSIS — F1121 Opioid dependence, in remission: Secondary | ICD-10-CM

## 2020-10-08 DIAGNOSIS — Z1159 Encounter for screening for other viral diseases: Secondary | ICD-10-CM

## 2020-10-08 DIAGNOSIS — Z114 Encounter for screening for human immunodeficiency virus [HIV]: Secondary | ICD-10-CM

## 2020-10-08 DIAGNOSIS — Z0000 Encounter for general adult medical examination without abnormal findings: Secondary | ICD-10-CM

## 2020-10-15 ENCOUNTER — Encounter: Payer: 59 | Admitting: Family Medicine

## 2020-10-27 ENCOUNTER — Other Ambulatory Visit: Payer: 59

## 2020-10-28 ENCOUNTER — Other Ambulatory Visit: Payer: 59

## 2020-10-28 ENCOUNTER — Other Ambulatory Visit: Payer: Self-pay

## 2020-10-29 LAB — HEPATITIS C ANTIBODY
Hepatitis C Ab: NONREACTIVE
SIGNAL TO CUT-OFF: 0 (ref <1.00)

## 2020-10-29 LAB — HIV ANTIBODY (ROUTINE TESTING W REFLEX): HIV 1&2 Ab, 4th Generation: NONREACTIVE

## 2020-10-29 LAB — CBC WITH DIFFERENTIAL/PLATELET
Absolute Monocytes: 359 cells/uL (ref 200–950)
Basophils Absolute: 23 cells/uL (ref 0–200)
Basophils Relative: 0.4 %
Eosinophils Absolute: 131 cells/uL (ref 15–500)
Eosinophils Relative: 2.3 %
HCT: 45.3 % (ref 38.5–50.0)
Hemoglobin: 14.9 g/dL (ref 13.2–17.1)
Lymphs Abs: 1875 cells/uL (ref 850–3900)
MCH: 30.8 pg (ref 27.0–33.0)
MCHC: 32.9 g/dL (ref 32.0–36.0)
MCV: 93.8 fL (ref 80.0–100.0)
MPV: 10.1 fL (ref 7.5–12.5)
Monocytes Relative: 6.3 %
Neutro Abs: 3312 cells/uL (ref 1500–7800)
Neutrophils Relative %: 58.1 %
Platelets: 295 10*3/uL (ref 140–400)
RBC: 4.83 10*6/uL (ref 4.20–5.80)
RDW: 12.2 % (ref 11.0–15.0)
Total Lymphocyte: 32.9 %
WBC: 5.7 10*3/uL (ref 3.8–10.8)

## 2020-10-29 LAB — COMPLETE METABOLIC PANEL WITH GFR
AG Ratio: 2.1 (calc) (ref 1.0–2.5)
ALT: 15 U/L (ref 9–46)
AST: 15 U/L (ref 10–40)
Albumin: 4.5 g/dL (ref 3.6–5.1)
Alkaline phosphatase (APISO): 75 U/L (ref 36–130)
BUN: 8 mg/dL (ref 7–25)
CO2: 31 mmol/L (ref 20–32)
Calcium: 9.8 mg/dL (ref 8.6–10.3)
Chloride: 104 mmol/L (ref 98–110)
Creat: 0.78 mg/dL (ref 0.60–1.24)
Globulin: 2.1 g/dL (calc) (ref 1.9–3.7)
Glucose, Bld: 90 mg/dL (ref 65–99)
Potassium: 4.3 mmol/L (ref 3.5–5.3)
Sodium: 141 mmol/L (ref 135–146)
Total Bilirubin: 0.5 mg/dL (ref 0.2–1.2)
Total Protein: 6.6 g/dL (ref 6.1–8.1)
eGFR: 129 mL/min/{1.73_m2} (ref >=60)

## 2020-10-29 LAB — LIPID PANEL
Cholesterol: 158 mg/dL (ref <200)
HDL: 49 mg/dL (ref >=40)
LDL Cholesterol (Calc): 78 mg/dL (calc)
Non-HDL Cholesterol (Calc): 109 mg/dL (calc) (ref <130)
Total CHOL/HDL Ratio: 3.2 (calc) (ref <5.0)
Triglycerides: 226 mg/dL — ABNORMAL HIGH (ref <150)

## 2020-10-29 LAB — TSH: TSH: 0.14 mIU/L — ABNORMAL LOW (ref 0.40–4.50)

## 2020-10-31 ENCOUNTER — Ambulatory Visit: Payer: 59 | Admitting: Family Medicine

## 2020-10-31 ENCOUNTER — Other Ambulatory Visit: Payer: Self-pay

## 2020-11-18 ENCOUNTER — Ambulatory Visit: Payer: 59 | Admitting: Family Medicine

## 2021-01-05 ENCOUNTER — Encounter: Payer: 59 | Admitting: Family Medicine

## 2021-12-12 IMAGING — CT CT CERVICAL SPINE W/O CM
3 of 4 series · 13 of 33 positions shown, 16 images · non-contrast
Comparison: None.

CLINICAL DATA: 25-year-old male with head trauma.

EXAM:
CT CERVICAL SPINE WITHOUT CONTRAST
TECHNIQUE: Multidetector CT imaging of the cervical spine was performed without
intravenous contrast. Multiplanar CT image reconstructions were also
generated.

[Series 4: sagittal bone · sagittal · 0.31mm/px · 5 of 65 slices shown, 6 images]
[im 22/65  bone]
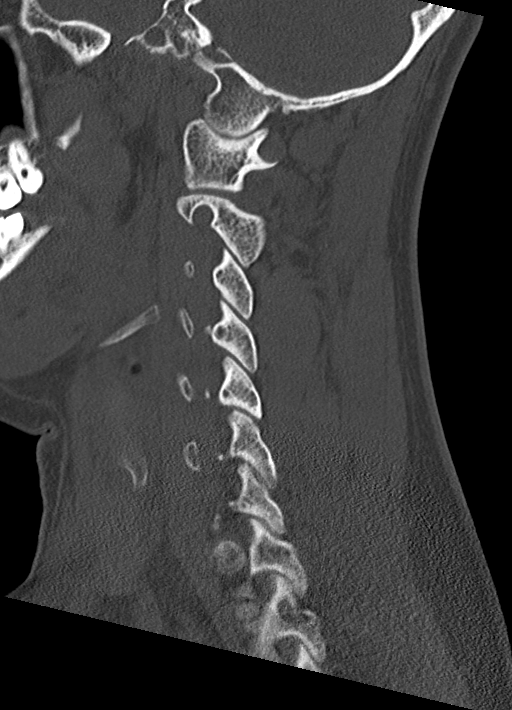
[im 27/65  bone]
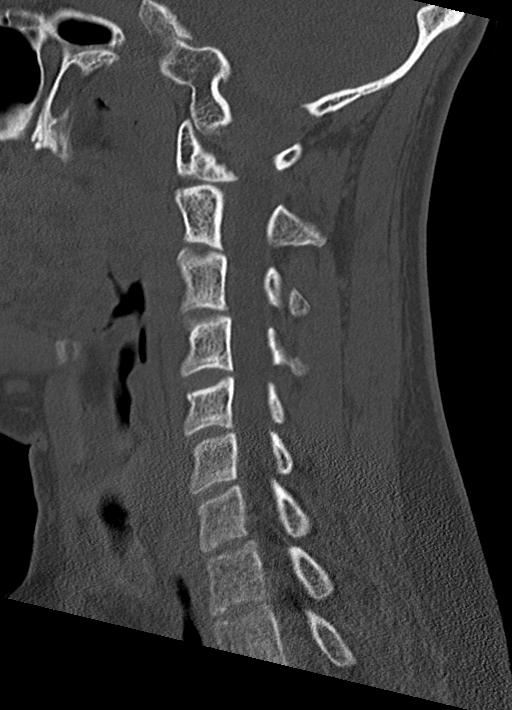
[im 33/65  soft-tissue]
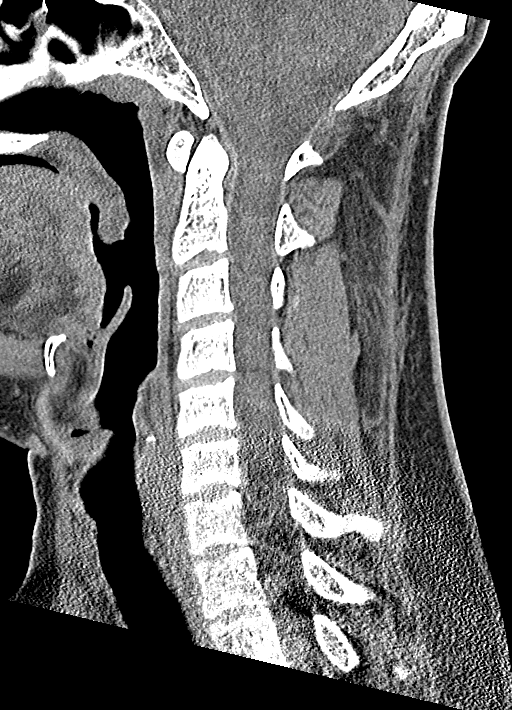
[im 33/65  bone]
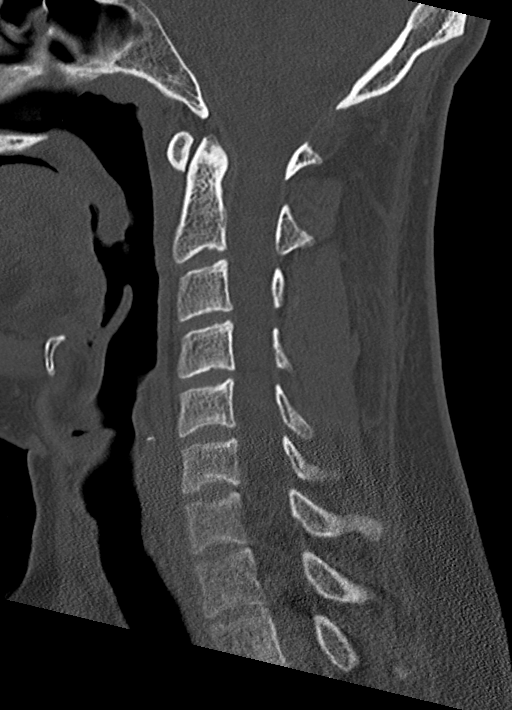
[im 38/65  bone]
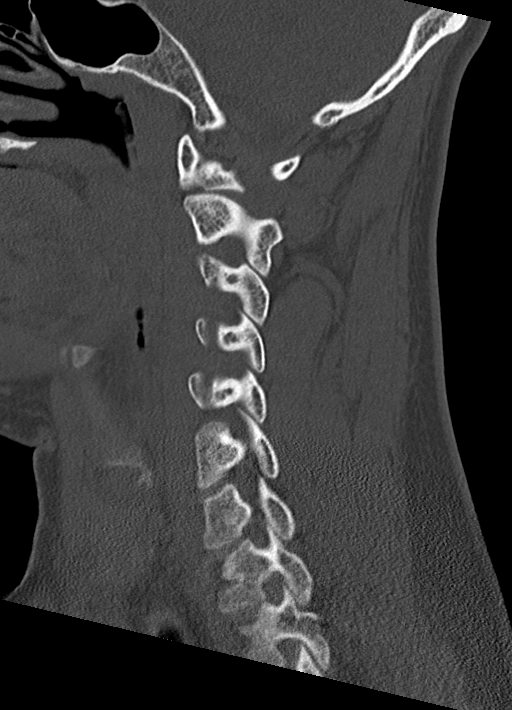
[im 43/65  bone]
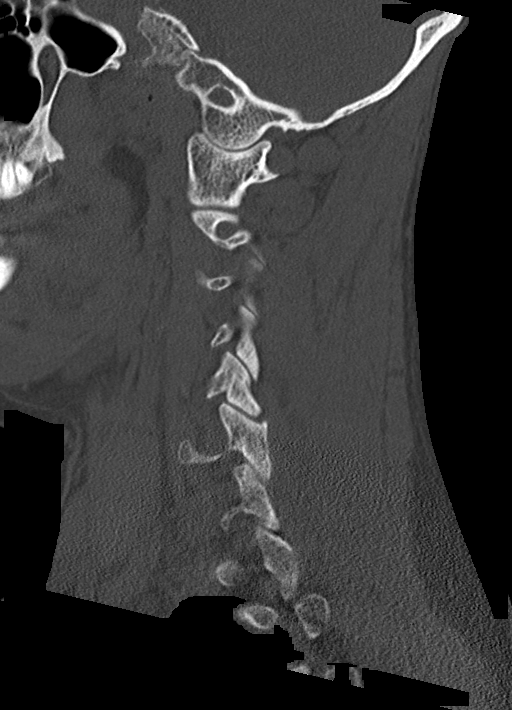

[Series 5: coronal bone · coronal · 0.27mm/px · 3 of 64 slices shown]
[im 13/64  bone]
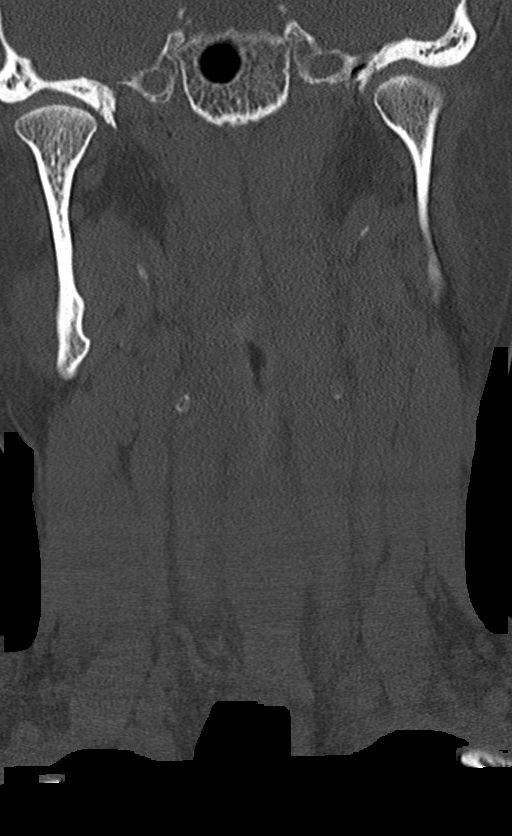
[im 26/64  bone]
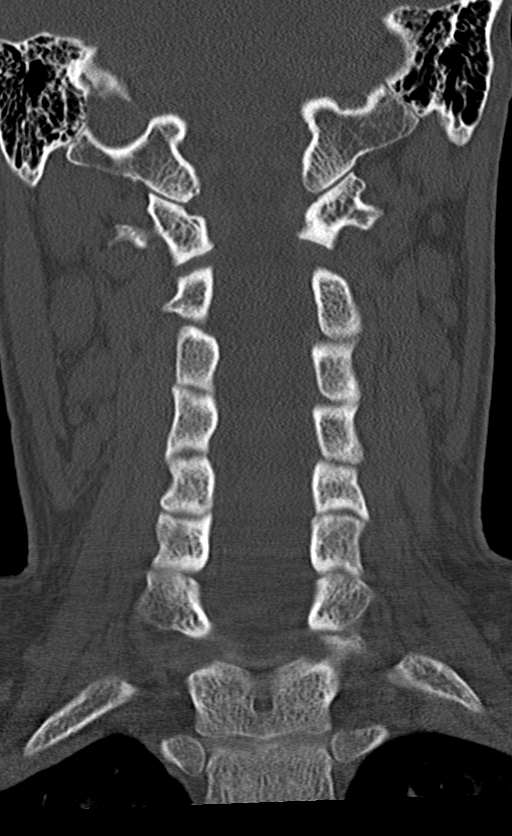
[im 38/64  bone]
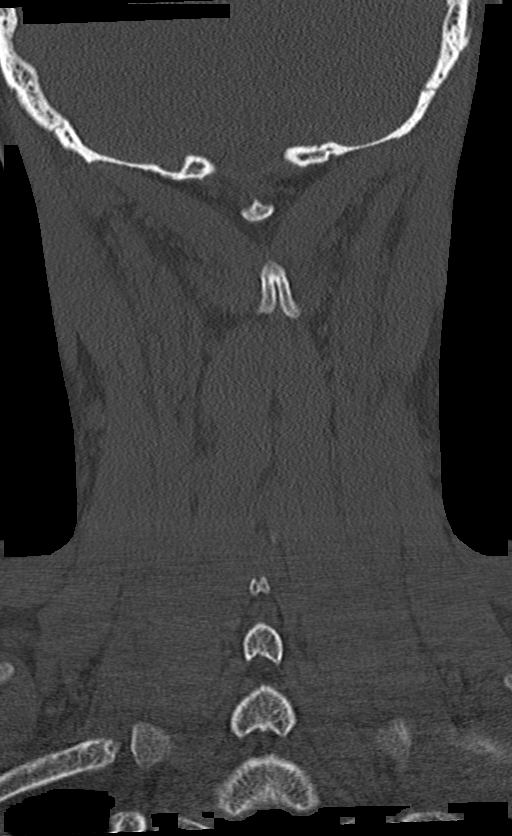

[Series 6: orthogonal bone · axial · 0.27mm/px · z∈[+135,+273]mm · 5 of 108 slices shown, 7 images]
[im 18/108  soft-tissue]
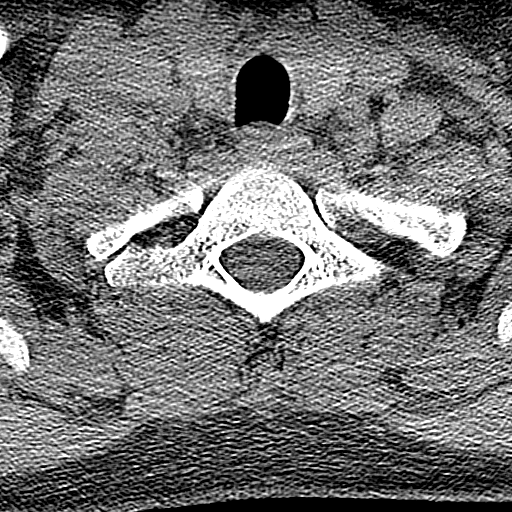
[im 18/108  bone]
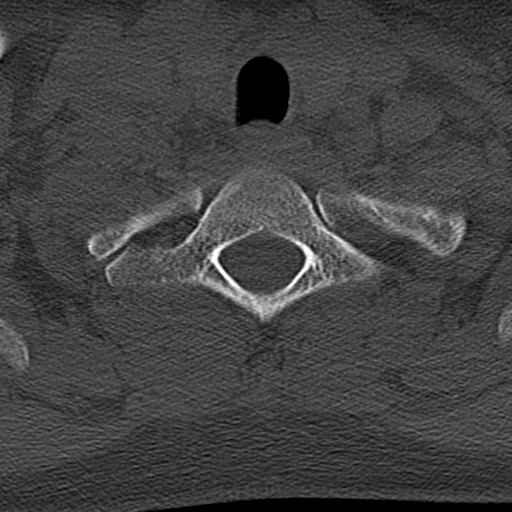
[im 36/108  bone]
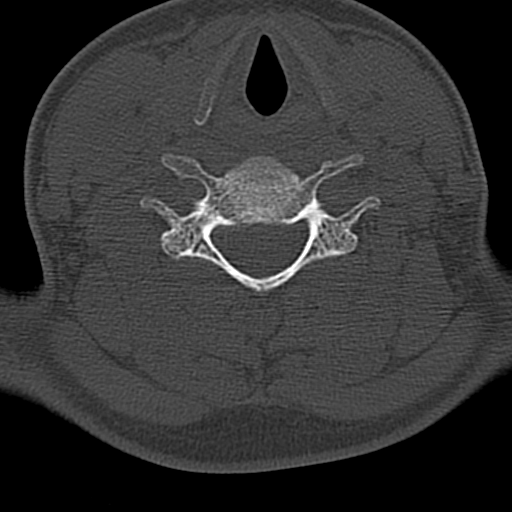
[im 54/108  bone]
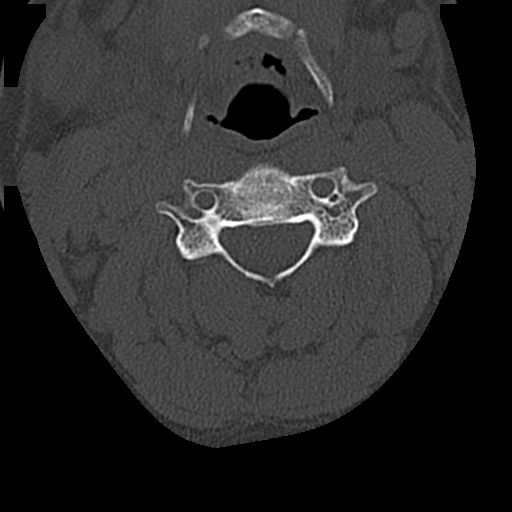
[im 72/108  bone]
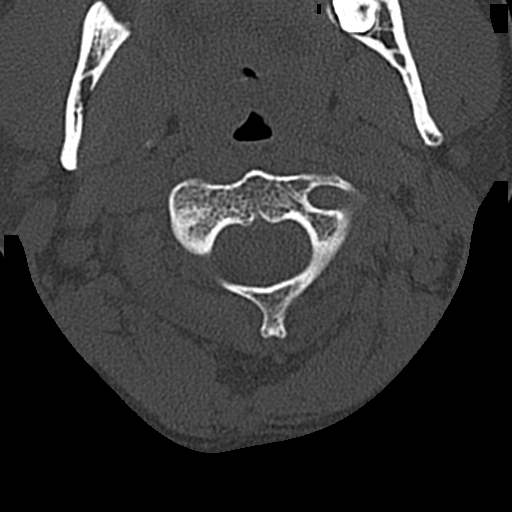
[im 90/108  soft-tissue]
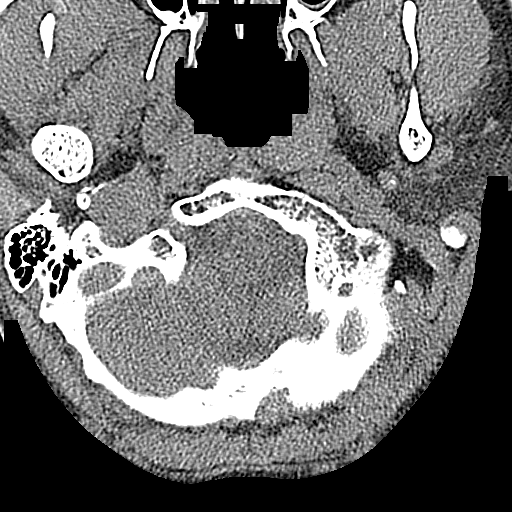
[im 90/108  bone]
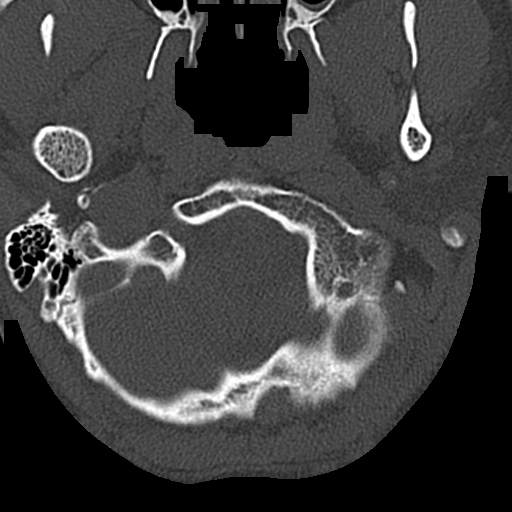

[13 of 33 positions shown; findings below may reference images not displayed]

FINDINGS: Alignment: Normal.

Skull base and vertebrae: No acute fracture. No primary bone lesion
or focal pathologic process.

Soft tissues and spinal canal: No prevertebral fluid or swelling. No
visible canal hematoma.

Disc levels:  No acute findings. No degenerative changes.

Upper chest: Negative.

Other: None
IMPRESSION: No acute/traumatic cervical spine pathology.

## 2021-12-12 IMAGING — CT CT HEAD W/O CM
3 series · 15 of 47 positions shown, 18 images · non-contrast
Comparison: Head CT 09/22/2018

CLINICAL DATA: Fall onto concrete. Head trauma. Positive loss of
consciousness.

EXAM:
CT HEAD WITHOUT CONTRAST
TECHNIQUE: Contiguous axial images were obtained from the base of the skull
through the vertex without intravenous contrast.

[Series 2: head wo · axial · 0.46mm/px · z∈[+317,+452]mm · 9 of 33 slices shown, 12 images]
[im 3/33  brain]
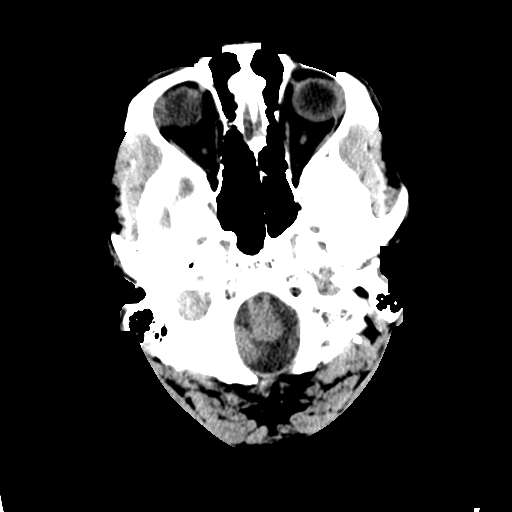
[im 3/33  bone]
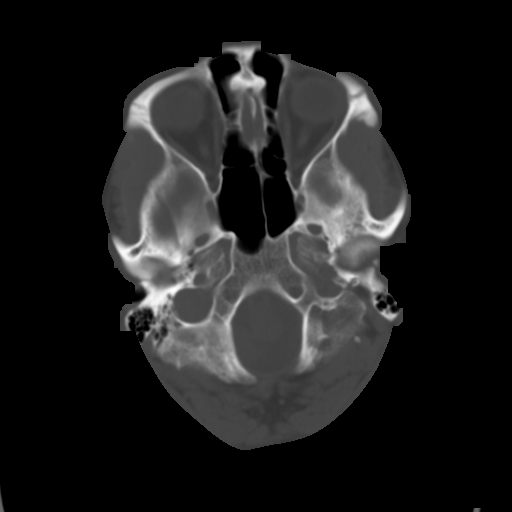
[im 6/33  brain]
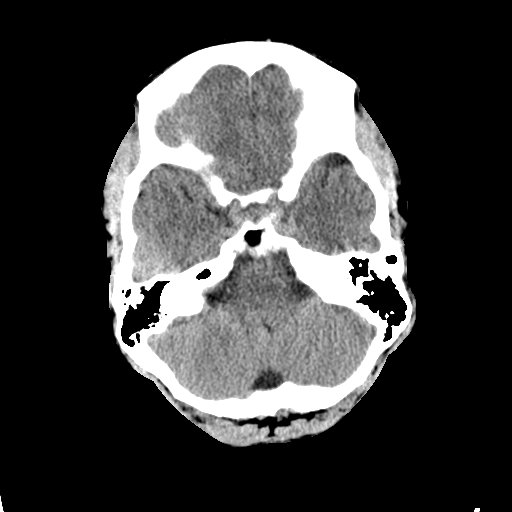
[im 9/33  brain]
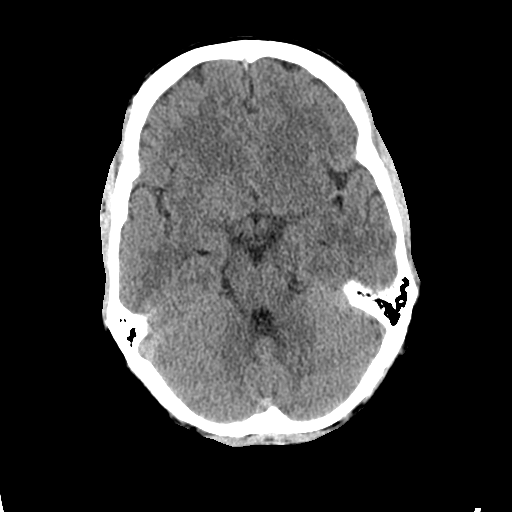
[im 13/33  brain]
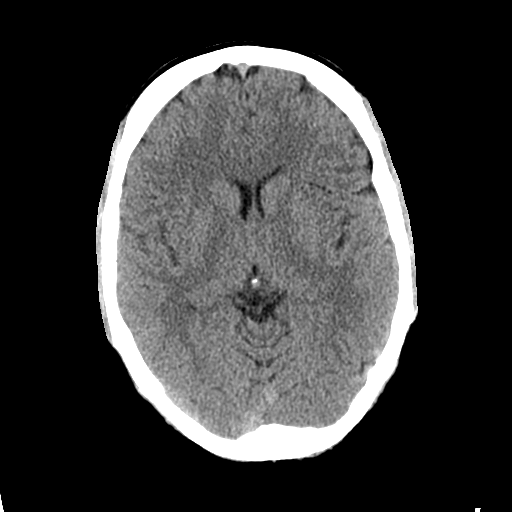
[im 17/33  brain]
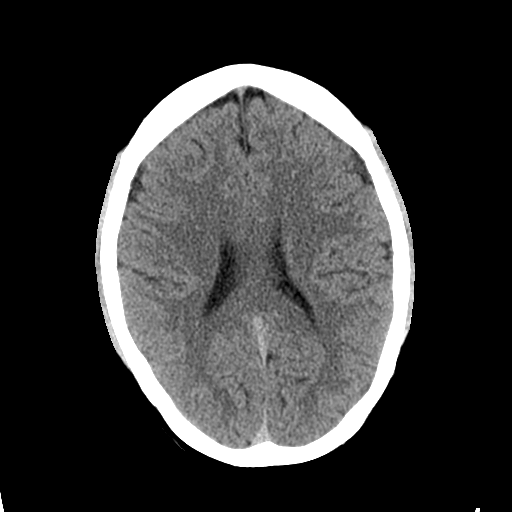
[im 17/33  bone]
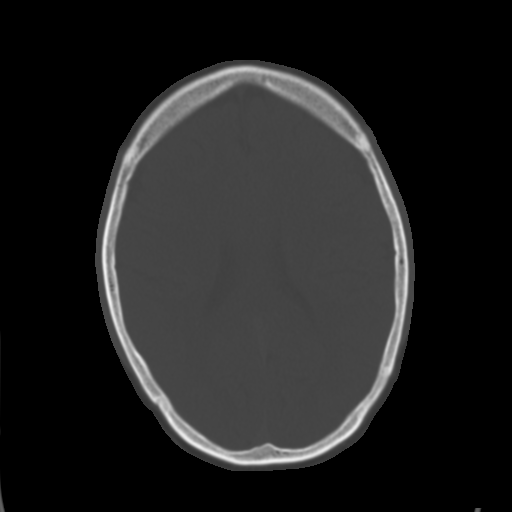
[im 20/33  brain]
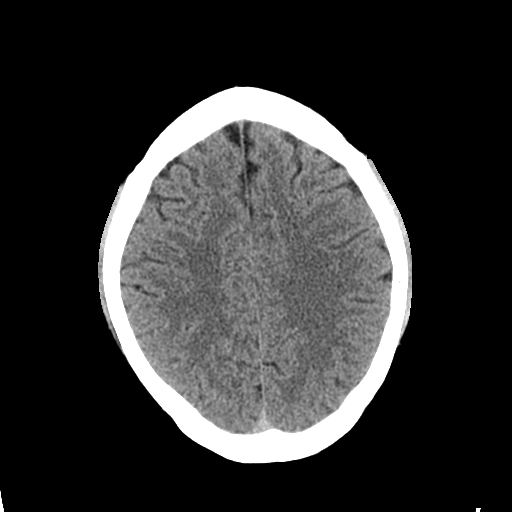
[im 24/33  brain]
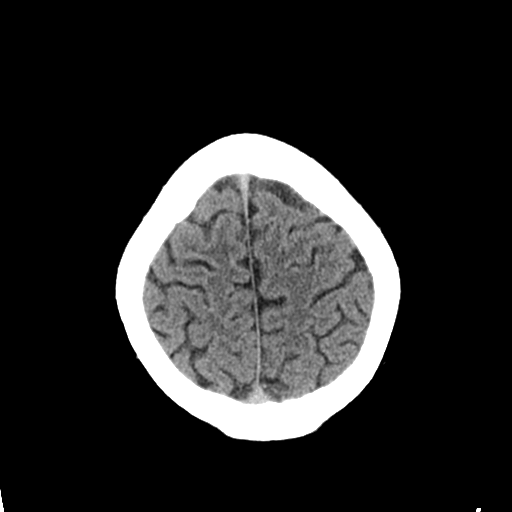
[im 27/33  brain]
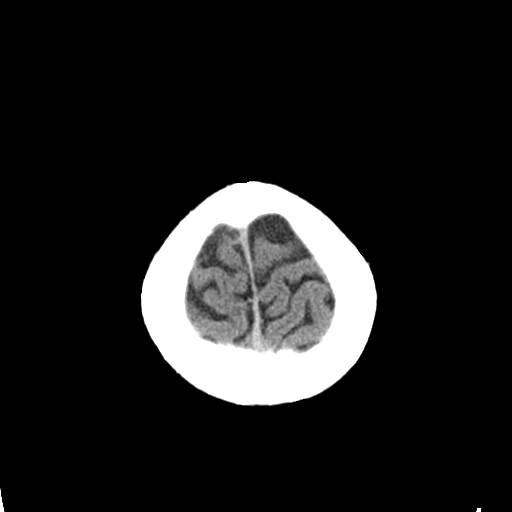
[im 30/33  brain]
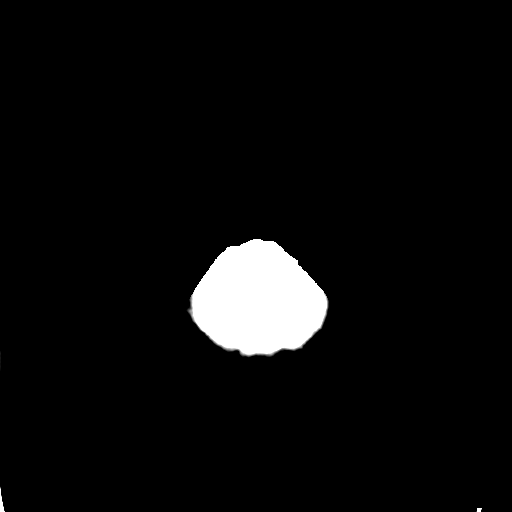
[im 30/33  bone]
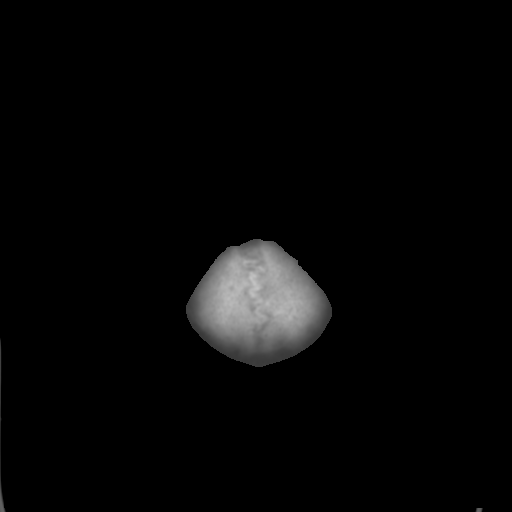

[Series 4: coronal soft tissue · coronal · 0.33mm/px · 3 of 69 slices shown]
[im 23/69  brain]
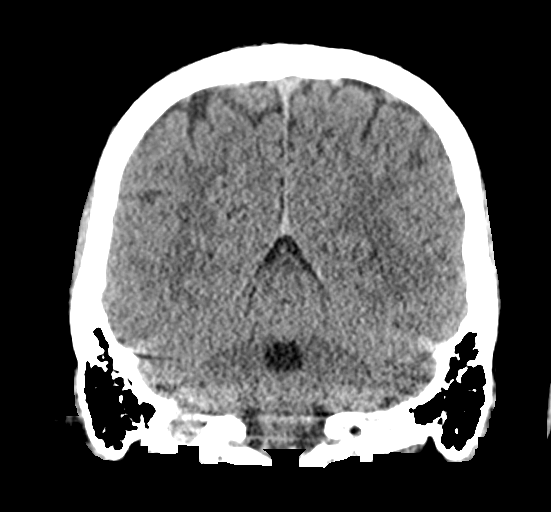
[im 31/69  brain]
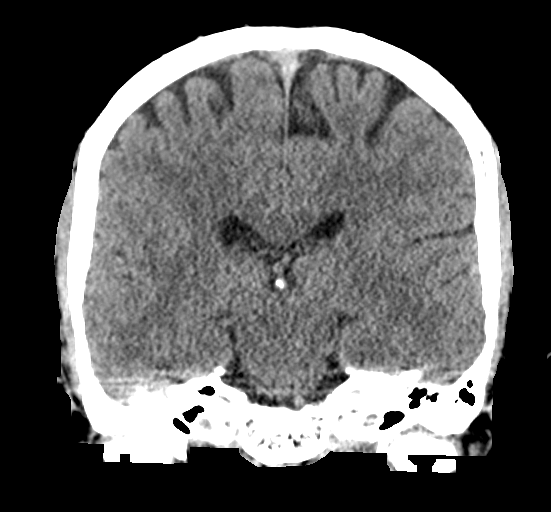
[im 38/69  brain]
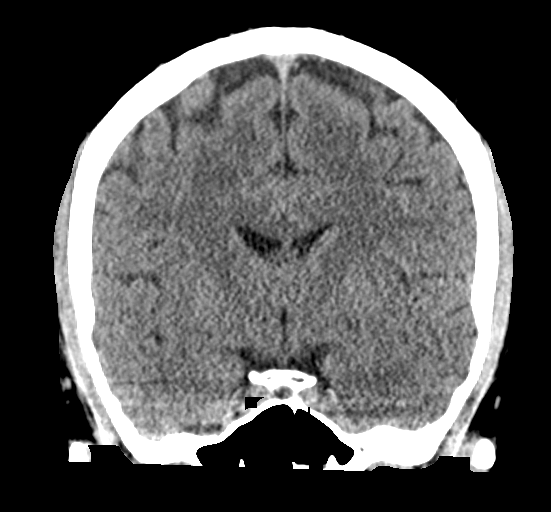

[Series 5: sagittal soft tissue · sagittal · 0.33mm/px · 3 of 54 slices shown]
[im 18/54  brain]
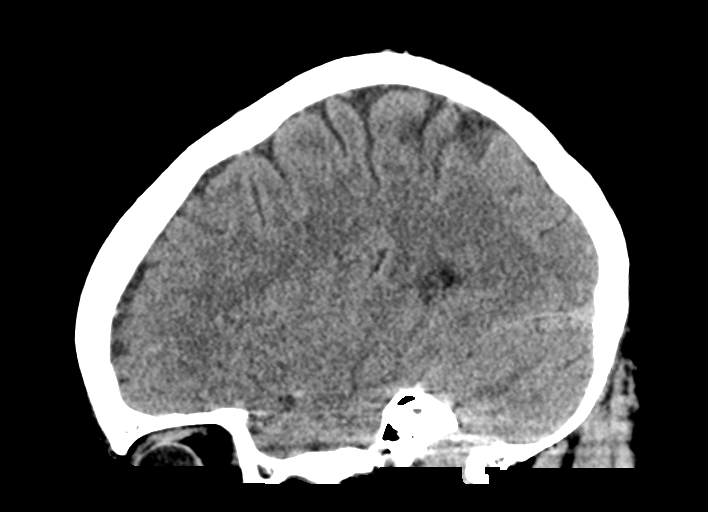
[im 27/54  brain]
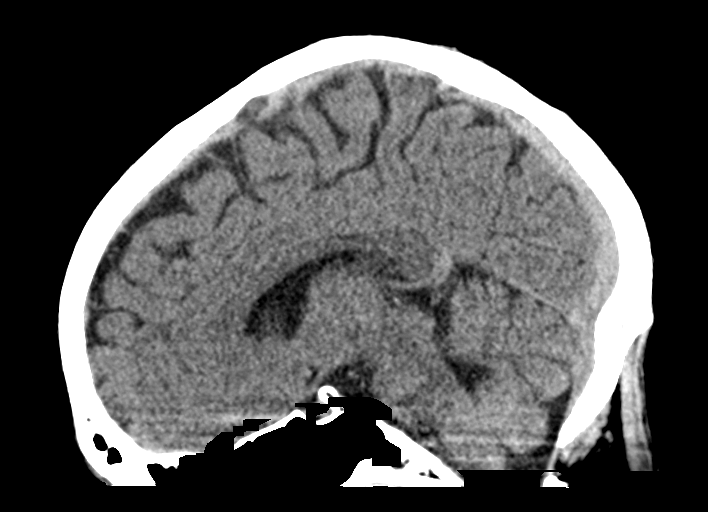
[im 36/54  brain]
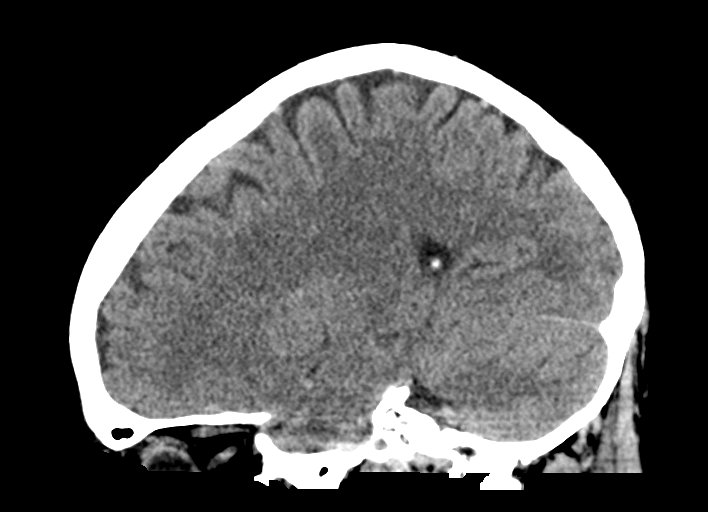

[15 of 47 positions shown; findings below may reference images not displayed]

FINDINGS: Brain: No evidence of acute infarction, hemorrhage, hydrocephalus,
extra-axial collection or mass lesion/mass effect.

Vascular: No hyperdense vessel or unexpected calcification.

Skull: Normal. Negative for fracture or focal lesion.

Sinuses/Orbits: Minor mucosal thickening of posterior left ethmoid
air cell. Paranasal sinuses otherwise clear. No sinus fluid level.
Mastoid air cells are clear. No acute orbital findings.

Other: None.
IMPRESSION: No acute intracranial abnormality. No skull fracture.

## 2022-01-11 ENCOUNTER — Emergency Department
Admission: EM | Admit: 2022-01-11 | Discharge: 2022-01-11 | Disposition: A | Discharge status: Home / Self Care | Payer: BC Managed Care – PPO | Attending: Emergency Medicine | Admitting: Emergency Medicine

## 2022-01-11 DIAGNOSIS — T40411A Poisoning by fentanyl or fentanyl analogs, accidental (unintentional), initial encounter: Secondary | ICD-10-CM

## 2022-01-11 LAB — CBC WITH DIFFERENTIAL/PLATELET
Abs Immature Granulocytes: 0.04 10*3/uL (ref 0.00–0.07)
Basophils Absolute: 0 10*3/uL (ref 0.0–0.1)
Basophils Relative: 0 %
Eosinophils Absolute: 0.1 10*3/uL (ref 0.0–0.5)
Eosinophils Relative: 1 %
HCT: 46.3 % (ref 39.0–52.0)
Hemoglobin: 15.3 g/dL (ref 13.0–17.0)
Immature Granulocytes: 0 %
Lymphocytes Relative: 20 %
Lymphs Abs: 1.9 10*3/uL (ref 0.7–4.0)
MCH: 30.2 pg (ref 26.0–34.0)
MCHC: 33 g/dL (ref 30.0–36.0)
MCV: 91.3 fL (ref 80.0–100.0)
Monocytes Absolute: 0.7 10*3/uL (ref 0.1–1.0)
Monocytes Relative: 7 %
Neutro Abs: 6.8 10*3/uL (ref 1.7–7.7)
Neutrophils Relative %: 72 %
Platelets: 255 10*3/uL (ref 150–400)
RBC: 5.07 MIL/uL (ref 4.22–5.81)
RDW: 13.2 % (ref 11.5–15.5)
WBC: 9.6 10*3/uL (ref 4.0–10.5)
nRBC: 0 % (ref 0.0–0.2)

## 2022-01-11 LAB — ETHANOL: Alcohol, Ethyl (B): <10 mg/dL (ref <10)

## 2022-01-11 LAB — COMPREHENSIVE METABOLIC PANEL
ALT: 15 U/L (ref 0–44)
AST: 22 U/L (ref 15–41)
Albumin: 4.5 g/dL (ref 3.5–5.0)
Alkaline Phosphatase: 57 U/L (ref 38–126)
Anion gap: 6 (ref 5–15)
BUN: 13 mg/dL (ref 6–20)
CO2: 24 mmol/L (ref 22–32)
Calcium: 9.4 mg/dL (ref 8.9–10.3)
Chloride: 110 mmol/L (ref 98–111)
Creatinine, Ser: 1.1 mg/dL (ref 0.61–1.24)
GFR, Estimated: >60 mL/min (ref >60)
Glucose, Bld: 112 mg/dL — ABNORMAL HIGH (ref 70–99)
Potassium: 3.9 mmol/L (ref 3.5–5.1)
Sodium: 140 mmol/L (ref 135–145)
Total Bilirubin: 0.7 mg/dL (ref 0.3–1.2)
Total Protein: 7.2 g/dL (ref 6.5–8.1)

## 2022-01-11 LAB — SALICYLATE LEVEL: Salicylate Lvl: <7 mg/dL — ABNORMAL LOW (ref 7.0–30.0)

## 2022-01-11 LAB — ACETAMINOPHEN LEVEL: Acetaminophen (Tylenol), Serum: <10 ug/mL — ABNORMAL LOW (ref 10–30)

## 2022-01-11 MED ORDER — NALOXONE HCL 4 MG/0.1ML NA LIQD: NASAL | 1 refills | Status: AC

## 2022-01-11 NOTE — ED Triage Notes (Signed)
Per pt, he did not know that percocet had fentanyl in in. Pt a&ox4 in triage.

## 2022-01-11 NOTE — ED Provider Triage Note (Signed)
Emergency Medicine Provider Triage Evaluation Note  Arthur Guerra , a 26 y.o. male  was evaluated in triage.  Pt complains of overdose. Patient with HX back pain and surgeries. Takes percocet. Patient took a percocet with fentanyl and had OD symptoms. Feels improved at this time. Patient was violent and hitting head on wall right after meds. EMS administered narcan and patient  currently feels improved  Review of Systems  Positive: OD on fentanyl/percocet. AMS, confusion, Negative: HA, CP, SOB  Physical Exam  BP 118/86   Pulse 90   Temp 98.5 F (36.9 C) (Oral)   Ht 6\' 2"  (1.88 m)   Wt 83.9 kg   SpO2 96%   BMI 23.75 kg/m  Gen:   Awake, no distress   Resp:  Normal effort  MSK:   Moves extremities without difficulty  Other:    Medical Decision Making  Medically screening exam initiated at 6:57 PM.  Appropriate orders placed.  Arthur Guerra was informed that the remainder of the evaluation will be completed by another provider, this initial triage assessment does not replace that evaluation, and the importance of remaining in the ED until their evaluation is complete.  OD on fentanyl/percocet. Narcan administered by EMS. Labs placed at this time   Brynda Peon 01/11/22 1857

## 2022-01-11 NOTE — ED Triage Notes (Signed)
Patient arrived by EMS for overdose. EMS administered 2mg  narcan. Mom called EMS and reports he was punching walls and hitting the walls with his head. Patient reports taking fentanyl and percocet.  EMS reports 172HR and high 80s O2 upon arrival. After narcan vitals: 95% RA 70sHR 127/70 b/p 173CBG
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
